# Patient Record
Sex: Female | Born: 1945 | Race: White | Hispanic: No | Smoking: Never smoker
Health system: Southern US, Community
[De-identification: ages and names within clinical notes are randomized; demographics above are authoritative.]

## PROBLEM LIST (undated history)

## (undated) DIAGNOSIS — I4891 Unspecified atrial fibrillation: Secondary | ICD-10-CM

## (undated) DIAGNOSIS — J449 Chronic obstructive pulmonary disease, unspecified: Secondary | ICD-10-CM

## (undated) DIAGNOSIS — J189 Pneumonia, unspecified organism: Secondary | ICD-10-CM

## (undated) DIAGNOSIS — T7840XA Allergy, unspecified, initial encounter: Secondary | ICD-10-CM

## (undated) DIAGNOSIS — R32 Unspecified urinary incontinence: Secondary | ICD-10-CM

## (undated) DIAGNOSIS — B159 Hepatitis A without hepatic coma: Secondary | ICD-10-CM

## (undated) DIAGNOSIS — J302 Other seasonal allergic rhinitis: Secondary | ICD-10-CM

## (undated) DIAGNOSIS — J9819 Other pulmonary collapse: Secondary | ICD-10-CM

## (undated) DIAGNOSIS — I10 Essential (primary) hypertension: Secondary | ICD-10-CM

## (undated) DIAGNOSIS — J45909 Unspecified asthma, uncomplicated: Secondary | ICD-10-CM

## (undated) HISTORY — DX: Unspecified asthma, uncomplicated: J45.909

## (undated) HISTORY — DX: Pneumonia, unspecified organism: J18.9

## (undated) HISTORY — PX: COLONOSCOPY: SHX174

## (undated) HISTORY — DX: Unspecified urinary incontinence: R32

## (undated) HISTORY — PX: DILATION AND CURETTAGE OF UTERUS: SHX78

## (undated) HISTORY — DX: Other pulmonary collapse: J98.19

## (undated) HISTORY — DX: Hepatitis a without hepatic coma: B15.9

## (undated) HISTORY — DX: Chronic obstructive pulmonary disease, unspecified: J44.9

## (undated) HISTORY — DX: Essential (primary) hypertension: I10

## (undated) HISTORY — DX: Allergy, unspecified, initial encounter: T78.40XA

## (undated) HISTORY — PX: TONSILLECTOMY: SUR1361

---

## 2000-05-07 HISTORY — PX: BREAST BIOPSY: SHX20

## 2003-05-08 DIAGNOSIS — I10 Essential (primary) hypertension: Secondary | ICD-10-CM | POA: Insufficient documentation

## 2003-05-08 HISTORY — DX: Essential (primary) hypertension: I10

## 2004-02-15 ENCOUNTER — Ambulatory Visit: Payer: Self-pay | Admitting: General Surgery

## 2005-02-22 ENCOUNTER — Ambulatory Visit: Payer: Self-pay | Admitting: General Surgery

## 2006-03-07 ENCOUNTER — Ambulatory Visit: Payer: Self-pay | Admitting: General Surgery

## 2007-03-11 ENCOUNTER — Ambulatory Visit: Payer: Self-pay | Admitting: General Surgery

## 2008-03-11 ENCOUNTER — Ambulatory Visit: Payer: Self-pay | Admitting: General Surgery

## 2009-03-14 ENCOUNTER — Ambulatory Visit: Payer: Self-pay | Admitting: General Surgery

## 2010-03-15 ENCOUNTER — Ambulatory Visit: Payer: Self-pay | Admitting: General Surgery

## 2011-05-11 ENCOUNTER — Ambulatory Visit: Payer: Self-pay | Admitting: General Surgery

## 2012-05-12 ENCOUNTER — Ambulatory Visit: Payer: Self-pay | Admitting: General Surgery

## 2012-05-23 LAB — URINALYSIS, COMPLETE
Bacteria: NONE SEEN
Bilirubin,UR: NEGATIVE
Glucose,UR: NEGATIVE mg/dL (ref 0–75)
Ketone: NEGATIVE
Leukocyte Esterase: NEGATIVE
Ph: 6 (ref 4.5–8.0)
Protein: NEGATIVE
RBC,UR: NONE SEEN /HPF (ref 0–5)
Specific Gravity: 1.006 (ref 1.003–1.030)
Squamous Epithelial: NONE SEEN
WBC UR: <1 /HPF (ref 0–5)

## 2012-05-23 LAB — COMPREHENSIVE METABOLIC PANEL
Albumin: 3.5 g/dL (ref 3.4–5.0)
Alkaline Phosphatase: 89 U/L (ref 50–136)
BUN: 17 mg/dL (ref 7–18)
Bilirubin,Total: 0.3 mg/dL (ref 0.2–1.0)
Calcium, Total: 8.2 mg/dL — ABNORMAL LOW (ref 8.5–10.1)
Co2: 27 mmol/L (ref 21–32)
EGFR (Non-African Amer.): >60
Osmolality: 281 (ref 275–301)
Potassium: 3.7 mmol/L (ref 3.5–5.1)
SGPT (ALT): 17 U/L (ref 12–78)
Sodium: 140 mmol/L (ref 136–145)
Total Protein: 7.1 g/dL (ref 6.4–8.2)

## 2012-05-23 LAB — PROTIME-INR
INR: 0.9
Prothrombin Time: 12.8 secs (ref 11.5–14.7)

## 2012-05-23 LAB — CBC
HGB: 13.4 g/dL (ref 12.0–16.0)
MCH: 31.3 pg (ref 26.0–34.0)
MCV: 92 fL (ref 80–100)

## 2012-05-23 LAB — MAGNESIUM: Magnesium: 1.9 mg/dL

## 2012-05-23 LAB — PROTEIN, CSF: Protein, CSF: 37 mg/dL (ref 15–45)

## 2012-05-24 ENCOUNTER — Inpatient Hospital Stay: Payer: Self-pay | Admitting: Cardiovascular Disease

## 2012-05-24 LAB — CSF CELL COUNT WITH DIFFERENTIAL
CSF Tube #: 3
Eosinophil: 0 %
Lymphocytes: 59 %
Lymphocytes: 67 %
Monocytes/Macrophages: 18 %
Monocytes/Macrophages: 22 %
Neutrophils: 11 %
Neutrophils: 24 %
Other Cells: 0 %
RBC (CSF): 542 /mm3
RBC (CSF): 60 /mm3
WBC (CSF): 2 /mm3
WBC (CSF): 5 /mm3

## 2012-05-24 LAB — LIPID PANEL
Cholesterol: 172 mg/dL (ref 0–200)
HDL Cholesterol: 73 mg/dL — ABNORMAL HIGH (ref 40–60)
Ldl Cholesterol, Calc: 85 mg/dL (ref 0–100)
Triglycerides: 69 mg/dL (ref 0–200)
VLDL Cholesterol, Calc: 14 mg/dL (ref 5–40)

## 2012-05-25 LAB — CBC WITH DIFFERENTIAL/PLATELET
Basophil #: 0 10*3/uL (ref 0.0–0.1)
Basophil %: 0.7 %
Eosinophil #: 0.1 10*3/uL (ref 0.0–0.7)
Eosinophil %: 1.8 %
Lymphocyte %: 43.9 %
MCV: 92 fL (ref 80–100)
Monocyte #: 0.6 x10 3/mm (ref 0.2–0.9)
Neutrophil %: 42.6 %
Platelet: 201 10*3/uL (ref 150–440)
RBC: 4.25 10*6/uL (ref 3.80–5.20)
RDW: 13.2 % (ref 11.5–14.5)

## 2012-05-25 LAB — BASIC METABOLIC PANEL
Anion Gap: 7 (ref 7–16)
BUN: 17 mg/dL (ref 7–18)
Chloride: 105 mmol/L (ref 98–107)
Co2: 26 mmol/L (ref 21–32)
EGFR (African American): >60
EGFR (Non-African Amer.): >60
Glucose: 105 mg/dL — ABNORMAL HIGH (ref 65–99)
Osmolality: 278 (ref 275–301)
Sodium: 138 mmol/L (ref 136–145)

## 2012-05-25 LAB — PROTIME-INR: INR: 1

## 2012-05-26 LAB — CSF CULTURE

## 2012-11-14 ENCOUNTER — Encounter: Payer: Self-pay | Admitting: *Deleted

## 2013-07-01 ENCOUNTER — Encounter: Payer: Self-pay | Admitting: General Surgery

## 2013-07-01 ENCOUNTER — Ambulatory Visit: Payer: Self-pay | Admitting: General Surgery

## 2013-07-16 ENCOUNTER — Ambulatory Visit (INDEPENDENT_AMBULATORY_CARE_PROVIDER_SITE_OTHER): Payer: Medicare Other | Admitting: General Surgery

## 2013-07-16 ENCOUNTER — Encounter: Payer: Self-pay | Admitting: General Surgery

## 2013-07-16 VITALS — BP 132/70 | HR 74 | Resp 14 | Ht 68.0 in | Wt 214.0 lb

## 2013-07-16 DIAGNOSIS — N6019 Diffuse cystic mastopathy of unspecified breast: Secondary | ICD-10-CM

## 2013-07-16 DIAGNOSIS — Z1211 Encounter for screening for malignant neoplasm of colon: Secondary | ICD-10-CM

## 2013-07-16 MED ORDER — POLYETHYLENE GLYCOL 3350 17 GM/SCOOP PO POWD: 1.0000 | Freq: Once | ORAL | Status: DC

## 2013-07-16 NOTE — Patient Instructions (Addendum)
Follow up in 1 year with bilateral screening mammogram and office visit  Colonoscopy A colonoscopy is an exam to look at the entire large intestine (colon). This exam can help find problems such as tumors, polyps, inflammation, and areas of bleeding. The exam takes about 1 hour.  LET [facility] PROVIDER KNOW ABOUT:   Any allergies you have.  All medicines you are taking, including vitamins, herbs, eye drops, creams, and over-the-counter medicines.  Previous problems you or members of your family have had with the use of anesthetics.  Any blood disorders you have.  Previous surgeries you have had.  Medical conditions you have. RISKS AND COMPLICATIONS  Generally, this is a safe procedure. However, as with any procedure, complications can occur. Possible complications include:  Bleeding.  Tearing or rupture of the colon wall.  Reaction to medicines given during the exam.  Infection (rare). BEFORE THE PROCEDURE   Ask your health care provider about changing or stopping your regular medicines.  You may be prescribed an oral bowel prep. This involves drinking a large amount of medicated liquid, starting the day before your procedure. The liquid will cause you to have multiple loose stools until your stool is almost clear or light green. This cleans out your colon in preparation for the procedure.  Do not eat or drink anything else once you have started the bowel prep, unless your health care provider tells you it is safe to do so.  Arrange for someone to drive you home after the procedure. PROCEDURE   You will be given medicine to help you relax (sedative).  You will lie on your side with your knees bent.  A long, flexible tube with a light and camera on the end (colonoscope) will be inserted through the rectum and into the colon. The camera sends video back to a computer screen as it moves through the colon. The colonoscope also releases carbon dioxide gas to inflate the  colon. This helps your health care provider see the area better.  During the exam, your health care provider may take a small tissue sample (biopsy) to be examined under a microscope if any abnormalities are found.  The exam is finished when the entire colon has been viewed. AFTER THE PROCEDURE   Do not drive for 24 hours after the exam.  You may have a small amount of blood in your stool.  You may pass moderate amounts of gas and have mild abdominal cramping or bloating. This is caused by the gas used to inflate your colon during the exam.  Ask when your test results will be ready and how you will get your results. Make sure you get your test results. Document Released: 04/20/2000 Document Revised: 02/11/2013 Document Reviewed: 12/29/2012 [facility] Patient Information 2014 [employer] [place] [place].   Patient has been [facility]. Patient is to hold Fish Oil supplements for 1 week prior to colonoscopy. She will reregister with the hospital at least 2 days prior. She will only take her Norvasc and Toprol blood pressure medications the morning of the colonoscopy with a small sip of water. The Miralax prescription has been sent to her pharmacy. Patient is aware of date and instructions.

## 2013-07-16 NOTE — Progress Notes (Signed)
Patient ID: Kathy GammonCherry B Ashley, female   DOB: 01/12/1946, 68 y.o.   MRN: [id number]  Chief Complaint  Patient presents with  . Follow-up    mammogram    HPI Kathy Ashley is a 68 y.o. female.  who presents for annual breast evaluation. The most recent mammogram was done on 07-01-13.  Patient does perform regular self breast checks and gets regular mammograms done.  No new breast issues.  HPI  Past Medical History  Diagnosis Date  . Hypertension 2005  . Allergy     Past Surgical History  Procedure Laterality Date  . Breast biopsy Right 2002  . Cesarean section    . Dilation and curettage of uterus    . Colonoscopy  2005    Family History  Problem Relation Age of Onset  . Diabetes Father     Social History History  Substance Use Topics  . Smoking status: Never Smoker   . Smokeless tobacco: Never Used  . Alcohol Use: Yes     Comment: wine occasionally    Allergies  Allergen Reactions  . Penicillins Rash    Current Outpatient Prescriptions  Medication Sig Dispense Refill  . amLODipine (NORVASC) 5 MG tablet Take 5 mg by mouth daily.       Marland Kitchen. escitalopram (LEXAPRO) 20 MG tablet Take 20 mg by mouth daily.       . furosemide (LASIX) 20 MG tablet Take 20 mg by mouth as needed.       Marland Kitchen. LORazepam (ATIVAN) 1 MG tablet Take 1 mg by mouth every 8 (eight) hours as needed.       . metoprolol succinate (TOPROL-XL) 50 MG 24 hr tablet Take 50 mg by mouth daily.       . polyethylene glycol powder (GLYCOLAX/MIRALAX) powder Take 255 g (1 Container total) by mouth once.  255 g  0   No current facility-administered medications for this visit.    Review of Systems Review of Systems  Constitutional: Negative.   Respiratory: Negative.   Cardiovascular: Negative.     Blood pressure 132/70, pulse 74, resp. rate 14, height 5\' 8"  (1.727 m), weight 214 lb (97.07 kg).  Physical Exam Physical Exam  Constitutional: She is oriented to person, place, and time. She appears well-developed and  well-nourished.  Eyes: No scleral icterus.  Neck: Neck supple.  Cardiovascular: Normal rate, regular rhythm and normal heart sounds.   Pulmonary/Chest: Effort normal and breath sounds normal. Right breast exhibits no inverted nipple, no mass, no nipple discharge, no skin change and no tenderness. Left breast exhibits no inverted nipple, no mass, no nipple discharge, no skin change and no tenderness.  Abdominal: Soft. Normal appearance.  Lymphadenopathy:    She has no cervical adenopathy.    She has no axillary adenopathy.  Neurological: She is alert and oriented to person, place, and time.  Skin: Skin is warm and dry.    Data Reviewed Mammogram reviewed and stable.   Assessment    Stable physical exam.     Plan      Follow up in 1 year with bilateral screening mammogram and office visit.  Last colonoscopy was 2005.  Colonoscopy with possible biopsy/polypectomy prn: Information regarding the procedure, including its potential risks and complications (including but not limited to perforation of the bowel, which may require emergency surgery to repair, and bleeding) was verbally given to the patient. Educational information regarding lower instestinal endoscopy was given to the patient. Written instructions for how to complete the  bowel prep using Miralax were provided. The importance of drinking ample fluids to avoid dehydration as a result of the prep emphasized.   Patient has been scheduled for a colonoscopy on 07/29/13 at [facility]. Patient is to hold Fish Oil supplements for 1 week prior to colonoscopy. She will reregister with the hospital at least 2 days prior. She will only take her Norvasc and Toprol blood pressure medications the morning of the colonoscopy with a small sip of water. The Miralax prescription has been sent to her pharmacy. Patient is aware of date and instructions.   Abdurrahman Petersheim G 07/16/2013, 10:53 AM

## 2013-07-25 ENCOUNTER — Other Ambulatory Visit: Payer: Self-pay | Admitting: General Surgery

## 2013-07-25 DIAGNOSIS — Z1211 Encounter for screening for malignant neoplasm of colon: Secondary | ICD-10-CM

## 2013-07-29 ENCOUNTER — Ambulatory Visit: Payer: Self-pay | Admitting: General Surgery

## 2013-07-29 DIAGNOSIS — Z1211 Encounter for screening for malignant neoplasm of colon: Secondary | ICD-10-CM

## 2013-07-29 DIAGNOSIS — K5730 Diverticulosis of large intestine without perforation or abscess without bleeding: Secondary | ICD-10-CM

## 2013-09-01 ENCOUNTER — Encounter: Payer: Self-pay | Admitting: General Surgery

## 2013-10-28 ENCOUNTER — Ambulatory Visit: Payer: Self-pay | Admitting: Ophthalmology

## 2013-10-28 DIAGNOSIS — I10 Essential (primary) hypertension: Secondary | ICD-10-CM

## 2013-10-28 DIAGNOSIS — Z01810 Encounter for preprocedural cardiovascular examination: Secondary | ICD-10-CM

## 2013-10-28 LAB — POTASSIUM: Potassium: 4.1 mmol/L (ref 3.5–5.1)

## 2013-11-11 ENCOUNTER — Ambulatory Visit: Payer: Self-pay | Admitting: Ophthalmology

## 2013-12-09 ENCOUNTER — Ambulatory Visit: Payer: Self-pay | Admitting: Ophthalmology

## 2014-03-08 ENCOUNTER — Encounter: Payer: Self-pay | Admitting: General Surgery

## 2014-05-07 DIAGNOSIS — J189 Pneumonia, unspecified organism: Secondary | ICD-10-CM

## 2014-05-07 DIAGNOSIS — J9819 Other pulmonary collapse: Secondary | ICD-10-CM

## 2014-05-07 DIAGNOSIS — B159 Hepatitis A without hepatic coma: Secondary | ICD-10-CM

## 2014-05-07 HISTORY — DX: Other pulmonary collapse: J98.19

## 2014-05-07 HISTORY — DX: Pneumonia, unspecified organism: J18.9

## 2014-05-07 HISTORY — DX: Hepatitis a without hepatic coma: B15.9

## 2014-05-31 ENCOUNTER — Ambulatory Visit: Payer: Self-pay | Admitting: Internal Medicine

## 2014-07-06 ENCOUNTER — Encounter: Payer: Self-pay | Admitting: General Surgery

## 2014-07-06 ENCOUNTER — Ambulatory Visit: Payer: Self-pay | Admitting: General Surgery

## 2014-07-12 ENCOUNTER — Ambulatory Visit: Payer: Medicare Other | Admitting: General Surgery

## 2014-07-14 ENCOUNTER — Encounter: Payer: Self-pay | Admitting: General Surgery

## 2014-07-14 ENCOUNTER — Ambulatory Visit (INDEPENDENT_AMBULATORY_CARE_PROVIDER_SITE_OTHER): Payer: Medicare Other | Admitting: General Surgery

## 2014-07-14 VITALS — BP 128/64 | HR 74 | Resp 14 | Ht 67.5 in | Wt 208.0 lb

## 2014-07-14 DIAGNOSIS — N6019 Diffuse cystic mastopathy of unspecified breast: Secondary | ICD-10-CM

## 2014-07-14 DIAGNOSIS — Z1211 Encounter for screening for malignant neoplasm of colon: Secondary | ICD-10-CM

## 2014-07-14 NOTE — Patient Instructions (Signed)
Patient to return in 1 year for follow up with bilateral screening mammogram. Continue self breast exams. Call office for any new breast issues or concerns.

## 2014-07-14 NOTE — Progress Notes (Signed)
Patient ID: Kathy Ashley, female   DOB: 05-28-1945, 69 y.o.   MRN: [id number]  Chief Complaint  Patient presents with  . Follow-up    mammogram    HPI Kathy Ashley is a 69 y.o. female who presents for a breast evaluation. The most recent mammogram was done on 07/06/14. Patient does perform regular self breast checks and gets regular mammograms done. She denies any problems at this time.     Last yr she has Hepatitis A which resolved, later developed pneumonia and chronic cough, slowly getting better  HPI  Past Medical History  Diagnosis Date  . Hypertension 2005  . Allergy   . Hepatitis A 2016  . Collapsed lung 2016  . Pneumonia 2016    Past Surgical History  Procedure Laterality Date  . Breast biopsy Right 2002  . Cesarean section    . Dilation and curettage of uterus    . Colonoscopy  4098,1191    Family History  Problem Relation Age of Onset  . Diabetes Father     Social History History  Substance Use Topics  . Smoking status: Never Smoker   . Smokeless tobacco: Never Used  . Alcohol Use: Yes     Comment: wine occasionally    Allergies  Allergen Reactions  . Penicillins Rash    Current Outpatient Prescriptions  Medication Sig Dispense Refill  . albuterol (PROVENTIL HFA;VENTOLIN HFA) 108 (90 BASE) MCG/ACT inhaler Inhale 2 puffs into the lungs 2 (two) times daily.    Marland Kitchen albuterol (PROVENTIL) (2.5 MG/3ML) 0.083% nebulizer solution Take 2.5 mg by nebulization every 6 (six) hours as needed for wheezing or shortness of breath.    Marland Kitchen amLODipine (NORVASC) 5 MG tablet Take 5 mg by mouth daily.     Marland Kitchen aspirin 81 MG tablet Take 81 mg by mouth daily.    Marland Kitchen escitalopram (LEXAPRO) 20 MG tablet Take 20 mg by mouth daily.     . furosemide (LASIX) 20 MG tablet Take 20 mg by mouth as needed.     Marland Kitchen LORazepam (ATIVAN) 1 MG tablet Take 1 mg by mouth every 8 (eight) hours as needed.     . metoprolol succinate (TOPROL-XL) 50 MG 24 hr tablet Take 50 mg by mouth  daily.     . montelukast (SINGULAIR) 10 MG tablet Take 10 mg by mouth daily.  4  . Omega-3 Fatty Acids (FISH OIL) 1200 MG CAPS Take 1 capsule by mouth daily.    . pantoprazole (PROTONIX) 40 MG tablet Take 1 tablet by mouth daily.     No current facility-administered medications for this visit.    Review of Systems Review of Systems  Constitutional: Negative.   Respiratory: Negative.   Cardiovascular: Negative.     Blood pressure 128/64, pulse 74, resp. rate 14, height 5' 7.5" (1.715 m), weight 208 lb (94.348 kg).  Physical Exam Physical Exam  Constitutional: She is oriented to person, place, and time. She appears well-developed and well-nourished.  Neck: Neck supple. No thyromegaly present.  Cardiovascular: Normal rate, regular rhythm and normal heart sounds.   No murmur heard. Pulmonary/Chest: Effort normal and breath sounds normal. Right breast exhibits no inverted nipple, no mass, no nipple discharge, no skin change and no tenderness. Left breast exhibits no inverted nipple, no mass, no nipple discharge, no skin change and no tenderness.  Abdominal: Soft. Normal appearance and bowel sounds are normal. There is no hepatosplenomegaly. There is no tenderness. No hernia.  Lymphadenopathy:    She has  no cervical adenopathy.    She has no axillary adenopathy.  Neurological: She is alert and oriented to person, place, and time.  Skin: Skin is warm and dry.    Data Reviewed  Mammogram reviewed and stable.   Assessment    Stable exam. History of benign breast biopsies in past    Plan    Patient to return in 1 year with bilateral screening mammogram.        Kathy Ashley G 07/14/2014, 11:00 AM

## 2014-08-17 ENCOUNTER — Ambulatory Visit
Admit: 2014-08-17 | Disposition: A | Discharge status: Home / Self Care | Payer: Self-pay | Attending: Cardiovascular Disease | Admitting: Cardiovascular Disease

## 2014-08-27 NOTE — Discharge Summary (Signed)
Dates of Admission and Diagnosis:   Date of Admission 24-May-2012    Date of Discharge 25-May-2012    Admitting Diagnosis Right sided weakness, HA    Final Diagnosis Right acute sphenoid sinusitis    Discharge Diagnosis 1 Right acute sphenoid sinusitis    2 HTN    3 Hyperlipidemia    4 BLE edema     Chief Complaint/History of Present Illness Right sided weakness with HA and difficulty forming words per patient report three days ago.  Hx of HTN, HL and BLE edema.  HA has resolved, no neurological deficits during hospitalization.  Head CT was no acute abnormality and right sphenoid sinusitis for which Levaquin 750 mg IV daily was initiated.  MRI of brain with no acute infarct.  [place] bilateral carotid Doppler with no significant stenosis.  CXR neg.  Labs overall WNL.  LP neg.   Hepatic:  17-Jan-14 20:16    Bilirubin, Total 0.3   Alkaline Phosphatase 89   SGPT (ALT) 17   SGOT (AST)  14   Total Protein, Serum 7.1   Albumin, Serum 3.5  Routine Micro:  17-Jan-14 22:17    Micro Text Report CSF CULTURE/AERO/GRAM ST   COMMENT                   NO GROWTH IN 8-12 HOURS   GRAM STAIN                RARE WHITE BLOOD CELLS   GRAM STAIN                RARE RED BLOOD CELLS   GRAM STAIN                NO ORGANISMS SEEN   ANTIBIOTIC              Specimen Source CSF   Culture Comment NO GROWTH IN 8-12 HOURS   Gram Stain 1 RARE WHITE BLOOD CELLS   Gram Stain 2 RARE RED BLOOD CELLS   Gram Stain 3 NO ORGANISMS SEEN  Result(s) reported on 24 May 2012 at 11:46AM.  Cardiology:  17-Jan-14 20:36    Ventricular Rate 73   Atrial Rate 73   P-R Interval 154   QRS Duration 84   QT 384   QTc 423   P Axis 71   R Axis -22   T Axis 3   ECG interpretation Normal sinus rhythm Low voltage QRS Inferior infarct , age undetermined Cannot rule out Anterior infarct , age undetermined Abnormal ECG No previous ECGs available ----------unconfirmed---------- Confirmed by OVERREAD, NOT ([id number]), editor  PEARSON, BARBARA ([id number]) on 05/24/2012 7:44:14 AM  18-Jan-14 11:24    Echo Doppler  Interpretation Summary   Left ventricular systolic function is normal.ef 55% The left atrium  is mildly dilated. The right atrium is mildly dilated. There is mild  to moderate mitral regurgitation. There is mild to moderate  tricuspid regurgitation. no apparent source of cva  Procedure:   A two-dimensional transthoracic echocardiogram with color flow and  Doppler was performed.  Left Ventricle   The left ventricle is normal in size.   Left ventricular systolic function is normal.  Right Ventricle   The right ventricle is normal in size and function.  Atria   The left atrium is mildly dilated.   The right atrium is mildly dilated.  Mitral Valve   The mitral valve leaflets appear thickened, but open well.   There is mild to moderate mitral regurgitation.  Tricuspid Valve   The tricuspid valve is not well visualized, but is grossly normal.   There is mild to moderate tricuspid regurgitation.  Aortic Valve   The aortic valve is normal in structure and function.   No aortic regurgitation is present.  Pulmonic Valve   The pulmonic valve is not well seen, but is grossly normal.  Great Vessels   The aortic root is normal size.  Pericardium/Pleural   No pericardial effusion.  MMode 2D Measurements and Calculations   IVSd: 1.00 cm   LVIDd: 4.7 cm   LVIDs: 3.0 cm   LVPWd: 1.0 cm   FS: 35 %   EF(Teich): 64 %   Ao root diam: 2.6 cm   LA dimension: 4.4 cm  Doppler Measurements and Calculations   MV E point: 83 cm/sec   MV A point: 63 cm/sec   MV E/A: 1.3    MV dec time: 0.24 sec   TR Max vel: 263 cm/sec   TR Max PG: 28 mmHg   RVSP: 33 mmHg   RAP systole: 5.0 mmHg  Reading Physician: Serafina Royals  Sonographer: Lana Fish Interpreting Physician:  Serafina Royals,  electronically signed on  05-25-2012 06:55:12 Requesting Physician: Serafina Royals  Routine Chem:  17-Jan-14 20:16     Glucose, Serum 95   BUN 17   Creatinine (comp) 0.63   Sodium, Serum 140   Potassium, Serum 3.7   Chloride, Serum 107   CO2, Serum 27   Calcium (Total), Serum  8.2   Anion Gap  6   Osmolality (calc) 281   eGFR (African American) >60   eGFR (Non-African American) >60 (eGFR values <47mL/min/1.73 m2 may be an indication of chronic kidney disease (CKD). Calculated eGFR is useful in patients with stable renal function. The eGFR calculation will not be reliable in acutely ill patients when serum creatinine is changing rapidly. It is not useful in  patients on dialysis. The eGFR calculation may not be applicable to patients at the low and high extremes of body sizes, pregnant women, and vegetarians.)   Magnesium, Serum 1.9 (1.8-2.4 THERAPEUTIC RANGE: 4-7 mg/dL TOXIC: > 10 mg/dL  -----------------------)  18-Jan-14 06:39    Cholesterol, Serum 172   Triglycerides, Serum 69   HDL (INHOUSE)  73   VLDL Cholesterol Calculated 14   LDL Cholesterol Calculated 85 (Result(s) reported on 24 May 2012 at 08:13AM.)  19-Jan-14 04:58    Glucose, Serum  105   BUN 17   Creatinine (comp) 0.84   Sodium, Serum 138   Potassium, Serum 3.9   Chloride, Serum 105   CO2, Serum 26   Calcium (Total), Serum  8.1   Anion Gap 7   Osmolality (calc) 278   eGFR (African American) >60   eGFR (Non-African American) >60 (eGFR values <56mL/min/1.73 m2 may be an indication of chronic kidney disease (CKD). Calculated eGFR is useful in patients with stable renal function. The eGFR calculation will not be reliable in acutely ill patients when serum creatinine is changing rapidly. It is not useful in  patients on dialysis. The eGFR calculation may not be applicable to patients at the low and high extremes of body sizes, pregnant women, and vegetarians.)  Routine UA:  17-Jan-14 20:16    Color (UA) Straw   Clarity (UA) Clear   Glucose (UA) Negative   Bilirubin (UA) Negative   Ketones (UA) Negative   Specific  Gravity (UA) 1.006   Blood (UA) Negative   pH (UA) 6.0   Protein (UA)  Negative   Nitrite (UA) Negative   Leukocyte Esterase (UA) Negative (Result(s) reported on 23 May 2012 at 08:39PM.)   RBC (UA) NONE SEEN   WBC (UA) <1 /HPF   Bacteria (UA) NONE SEEN   Epithelial Cells (UA) NONE SEEN  Result(s) reported on 23 May 2012 at 08:39PM.  CSF Analysis:  17-Jan-14 22:17    CSF Tube # 3   CSF Tube # 1   Color - Uncentrifuged (CSF) COLORLESS   Color - Uncentrifuged (CSF) COLORLESS   Clarity (CSF) CLEAR   Clarity (CSF) HAZY   Color - Centrifuged (CSF) COLORLESS   Color - Centrifuged (CSF) COLORLESS   RBC  (CSF) 60   RBC  (CSF) 542   WBC  (CSF) 2   WBC  (CSF) 5   Neutrophils  (CSF) 11   Neutrophils  (CSF) 24   Lymphocytes  (CSF) 67   Lymphocytes  (CSF) 59   Monocytes/Macrophages  (CSF) 22   Monocytes/Macrophages  (CSF) 18   Eosinophil  (CSF) 0   Eosinophil  (CSF) 0   Other Cells  (CSF) 0 (Result(s) reported on 24 May 2012 at 12:29AM.)   Other Cells  (CSF) 0 (Result(s) reported on 24 May 2012 at 12:24AM.)   Protein, CSF 37 (Result(s) reported on 23 May 2012 at 11:46PM.)   Glucose, CSF 54 (Result(s) reported on 23 May 2012 at 11:46PM.)  Routine Coag:  17-Jan-14 20:16    Prothrombin 12.8   INR 0.9 (INR reference interval applies to patients on anticoagulant therapy. A single INR therapeutic range for coumarins is not optimal for all indications; however, the suggested range for most indications is 2.0 - 3.0. Exceptions to the INR Reference Range may include: Prosthetic heart valves, acute myocardial infarction, prevention of myocardial infarction, and combinations of aspirin and anticoagulant. The need for a higher or lower target INR must be assessed individually. Reference: The Pharmacology and Management of the Vitamin K  antagonists: the seventh ACCP Conference on Antithrombotic and Thrombolytic Therapy. WIOXB.3532 Sept:126 (3suppl): [id number]. A HCT value >55% may  artifactually increase the PT.  In one study,  the increase was an average of 25%. Reference:  "Effect on Routine and Special Coagulation Testing Values of Citrate Anticoagulant Adjustment in Patients with High HCT Values." American Journal of Clinical Pathology 2006;126:400-405.)  19-Jan-14 04:58    Prothrombin 13.4   INR 1.0 (INR reference interval applies to patients on anticoagulant therapy. A single INR therapeutic range for coumarins is not optimal for all indications; however, the suggested range for most indications is 2.0 - 3.0. Exceptions to the INR Reference Range may include: Prosthetic heart valves, acute myocardial infarction, prevention of myocardial infarction, and combinations of aspirin and anticoagulant. The need for a higher or lower target INR must be assessed individually. Reference: The Pharmacology and Management of the Vitamin K  antagonists: the seventh ACCP Conference on Antithrombotic and Thrombolytic Therapy. DJMEQ.6834 Sept:126 (3suppl): N9146842. A HCT value >55% may artifactually increase the PT.  In one study,  the increase was an average of 25%. Reference:  "Effect on Routine and Special Coagulation Testing Values of Citrate Anticoagulant Adjustment in Patients with High HCT Values." American Journal of Clinical Pathology 2006;126:400-405.)  Routine Hem:  17-Jan-14 20:16    WBC (CBC) 6.9   RBC (CBC) 4.28   Hemoglobin (CBC) 13.4   Hematocrit (CBC) 39.5   Platelet Count (CBC) 233 (Result(s) reported on 23 May 2012 at 08:35PM.)   MCV 92   MCH 31.3  MCHC 33.9   RDW 13.2  19-Jan-14 04:58    WBC (CBC) 5.5   RBC (CBC) 4.25   Hemoglobin (CBC) 13.3   Hematocrit (CBC) 39.0   Platelet Count (CBC) 201   MCV 92   MCH 31.3   MCHC 34.1   RDW 13.2   Neutrophil % 42.6   Lymphocyte % 43.9   Monocyte % 11.0   Eosinophil % 1.8   Basophil % 0.7   Neutrophil # 2.4   Lymphocyte # 2.4   Monocyte # 0.6   Eosinophil # 0.1   Basophil # 0.0 (Result(s)  reported on 25 May 2012 at 05:34AM.)   PERTINENT RADIOLOGY STUDIES: XRay:    18-Jan-14 12:03, Chest PA and Lateral   Chest PA and Lateral    REASON FOR EXAM:    cough, SOB  COMMENTS:       PROCEDURE: DXR - DXR CHEST PA (OR AP) AND LATERAL  - May 24 2012 12:03PM     RESULT: Comparison: None.    Findings:  The heart and mediastinum are within normal limits. No focal pulmonary   opacities. There is mild biapical pleural-parenchymal thickening.    IMPRESSION:   No acute cardiopulmonary disease.    Dictation Site: 8        Verified By: Gregor Hams, M.D., MD  [place]:    18-Jan-14 08:46, US Carotid Doppler Bilateral   US Carotid Doppler Bilateral    REASON FOR EXAM:    CVA  COMMENTS:       PROCEDURE: [place]  - US CAROTID DOPPLER BILATERAL  - May 24 2012  8:46AM     RESULT: Comparison: None    Technique: Gray-scale, color Doppler, and spectral Doppler images were   obtained of the extracranial carotid artery systems and vertebral   arteries in the neck.    Findings:  No significant atherosclerotic plaques identified in the extracranial   carotid arteries.  The peak systolic velocities are not elevated.  The vertebral arteries are patent bilaterally.    IMPRESSION:    No evidence of hemodynamically significant stenosis in the extracranial   carotid arteries.      Dictation Site: 8        Verified By: Gregor Hams, M.D., MD  MRI:    18-Jan-14 09:22, MRI Brain Without Contrast   MRI Brain Without Contrast    REASON FOR EXAM:    CVA  COMMENTS:       PROCEDURE: MR  - MR BRAIN WO CONTRAST  - May 24 2012  9:22AM     RESULT: Comparison: CT of the brain 05/23/2012    Technique: Standard brain protocol, without intravenous contrast.    Findings:  There are a fewscattered foci of T2 hyperintensity in the subcortical   white matter. 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 There are no areas of restricted    diffusion to suggest acute infarct. No evidenceof mass, mass effect,   midline shift, or extra-axial fluid collection. The intracranial flow     voids are unremarkable.    There is a small amount of fluid in the right mastoid air cells, which is   nonspecific. There is severe mucosal thickening of the right sphenoid   sinus.    IMPRESSION:   No acute intracranial findings. No acute infarct.      Dictation Site: 8        Verified By: Gregor Hams,  M.D., MD  CT:    17-Jan-14 15:18, CT Head Without Contrast   CT Head Without Contrast    REASON FOR EXAM:    headache  COMMENTS:   May transport without cardiac monitor    PROCEDURE: CT  - CT HEAD WITHOUT CONTRAST  - May 23 2012  3:18PM     RESULT: Axial noncontrast CT scanning was performed through the brain   with reconstructions at 5 mm intervals and slice thicknesses.    There is mild bifrontal cerebral atrophy. The ventricles are normal in   size and position. There is no intracranial hemorrhage nor intracranial   mass effect. There is no evidence of an evolving ischemic infarction.   There are no abnormal intracranial calcifications.    At bone window settings there is soft tissue density material in the   right sphenoid sinus consistent with mucoperiosteal thickening. No     definite fluid levels are demonstrated. There isno evidence of an acute   skull fracture.    IMPRESSION:   1. There is no evidence of acute abnormality in the brain.  2. There is soft tissue density material in the right sphenoid sinus cell   consistent with sinusitis.     Dictation Site: 1        Verified By: DAVID A. Martinique, M.D., MD   Hospital Course:   Hospital Course VSS overall.  HA has resolved, no neurological deficits during hospitalization.  Head CT was no acute abnormality and right sphenoid sinusitis for which Levaquin 750 mg IV daily was initiated.  MRI of brain with no acute infarct.  [place] bilateral carotid Doppler with no  significant stenosis.  CXR neg.  Labs overall WNL.  LP neg.  PT cleared patient with no neurological deficits.  Neurology consult was ordered but not done as patient is ready to be discharged today.    Condition on Discharge Stable   DISCHARGE INSTRUCTIONS HOME MEDS:  Medication Reconciliation:  Patient's Home Medications at Discharge:     Medication Instructions  proair hfa 90 mcg/inh inhalation aerosol  2 puff(s) inhaled 4 times a day as needed for shortness of breath/wheezing.    amlodipine 5 mg oral tablet  1 tab(s) orally once a day (in the morning)   metoprolol succinate 50 mg oral tablet, extended release  1 tab(s) orally once a day (in the morning)   lexapro 10 mg oral tablet  1 tab(s) orally once a day (at bedtime)   furosemide 20 mg oral tablet  1 tab(s) orally once a day as needed for fluid.   fish oil 1200 mg oral capsule  1 cap(s) orally once a day   aspirin enteric coated 81 mg oral delayed release tablet  1 tab(s) orally once a day (in the morning)   biotin  1 tab(s) orally once a day (in the morning)   acetaminophen 325 mg oral tablet  2 tab(s) orally every 6 hours, As needed, pain   simvastatin 20 mg oral tablet  1 tab(s) orally once a day (at bedtime)     PRESCRIPTIONS: PRINTED AND GIVEN TO PATIENT/FAMILY   Physician's Instructions:   Home Health? No    Treatments None    Home Oxygen? No    Diet Low Sodium  Low Fat, Low Cholesterol    Diet Consistency Regular Consistency    Activity Limitations As tolerated    Referrals None    Return to Work after follow up visit with MD    Time frame  for Follow Up Appointment 1-2 days    Other Comments Unable to access prescription writer in the system so will continue Levaquin 750 mg orally daily x 7 days and script written.  Given to patient.   Electronic Signatures: Kasandra Knudsen B (NP)  (Signed 19-Jan-14 10:08)  Authored: ADMISSION DATE AND DIAGNOSIS, CHIEF COMPLAINT/HPI, PERTINENT LABS, PERTINENT RADIOLOGY  STUDIES, HOSPITAL COURSE, DISCHARGE INSTRUCTIONS HOME MEDS, PATIENT INSTRUCTIONS   Last Updated: 19-Jan-14 10:08 by Kasandra Knudsen B (NP)

## 2014-08-27 NOTE — H&P (Signed)
PATIENT NAME:  Kathy, Ashley MR#:  [id number] DATE OF BIRTH:  [birth year]  DATE OF ADMISSION:  05/24/2012  PRIMARY CARE PHYSICIAN: Dr. Yves Dill   REFERRING DOCTOR:  Dr. Brien Mates.  CHIEF COMPLAINT: Severe headache since yesterday and right-sided weakness since this morning.   HISTORY OF PRESENT ILLNESS: The patient is a 69 year old Caucasian female with a past medical history of hypertension, chronic lower extremity edema and hyperlipidemia, takes fish oil, is presenting to the ER with the chief complaint of severe headache, which has started yesterday. The patient is reporting that she has been experiencing some dull headache for the past one week. Yesterday night while she was taking her trash she bent down and when she stood up she started having piercing right-sided headache. The headache was persistent and then eventually she has noticed that she was unable to complete sentences. The patient thought maybe she is anxious and she went to sleep. Today morning she started noticing right-sided weakness. She come into the ER, a CAT scan of the head was done which was negative for any acute findings. As the patient was persistently having piercing headache, LP was done by the ER physician and the CSF spinal fluid was clear in appearance. CSF glucose and protein were normal. The Hospitalist team is called to admit the patient as the patient is still having headache and the right-sided weakness. During my examination, the patient is reporting that she did not experience any difficulty in swallowing or dizziness or nausea. The dysarthria was quite transient and lasted for a few minutes and it was completely resolved last night. No similar complaints in the past. Denies any chest pain or shortness of breath. No other complaints.  PAST MEDICAL HISTORY: Hypertension, hyperlipidemia, takes fish oil, chronic lower extremity edema.   PAST SURGICAL HISTORY: None.   ALLERGIES: PENICILLIN.  MEDICATIONS: ProAir HFA  2 puffs inhalation q.4 hours as needed, metoprolol succinate 1 tablet p.o. once a day, Lexapro 10 mg once a day, furosemide 20 mg once a day, fish oil 1 capsule p.o. once a day, biotin 1 tablet p.o. once a day, aspirin 81 mg enteric-coated p.o. once daily, amlodipine 5 mg p.o. once daily.   PSYCHOSOCIAL HISTORY: Lives alone. Denies smoking, occasionally takes alcohol for social reasons. Denies any illicit drug usage.   FAMILY HISTORY: Mother had a triple bypass grafting for coronary artery disease and strokes, and dad deceased with heart attack. She is reporting that heart problems run in her family.   REVIEW OF SYSTEMS: CONSTITUTIONAL: Denies fever, fatigue, pain, weight loss or weight gain.  EYES: Denies any blurry vision, glaucoma, cataracts.  ENT: Denies tinnitus, ear pain, hearing loss, postnasal drip, sinus pain.  RESPIRATORY: Denies cough, wheezing, hemoptysis, or pneumonia.  CARDIOVASCULAR: Denies chest pain, palpitations, syncope.  GASTROINTESTINAL: Denies nausea, vomiting, diarrhea, hematemesis, melena.  GENITOURINARY: Denies dysuria, hematuria, renal calculi.  GYNECOLOGIC AND BREASTS: Denies breast mass, vaginal discharge.  ENDOCRINE: Denies polyuria, polyphagia, polydipsia.  HEMATOLOGIC AND LYMPHATIC: Denies anemia, easy bruising, swollen glands.  MUSCULOSKELETAL: Denies pain in the neck, complaining of headache but denies any back or shoulder pain. NEUROLOGIC: Complaining of right-sided weakness. Denies any vertigo, ataxia, dementia, complaining of headaches for the past one week which is worse since yesterday night.  PSYCHIATRIC: Denies insomnia, ADD, OCD.   PHYSICAL EXAMINATION:  VITAL SIGNS: Temperature 98.4, pulse 68, respirations 18, blood pressure 128/71, pulse ox 98% on room air.  GENERAL APPEARANCE: Not in acute distress, moderately built and moderately nourished.  HEENT: Normocephalic,  atraumatic. Pupils are equally reacting to light and accommodation. No conjunctival  injection. Nasal: No congestion. No sinus tenderness. No postnasal drip.  NECK: Supple. No JVD. No carotid bruits. No thyromegaly.  LUNGS: Clear to auscultation bilaterally. No crackles. No wheezing. No accessory muscle usage. No anterior chest wall tenderness on palpation.  CARDIAC: S1, S2 normal. Regular rate and rhythm. No murmurs.  GASTROINTESTINAL: Soft. Bowel sounds are positive in all four quadrants. Nontender, nondistended. No masses felt.  NEUROLOGIC: Awake, alert, oriented x 3. Cranial nerves II through XII are grossly intact. No deviation of the angle of mouth. No cerebellar signs. MOTOR: Left upper and lower extremity strength is 5 out of 5, right upper extremity motor is 3 to 4 out of 5, right lower extremity motor is 3 to 4 out of 5. Sensory is intact. Reflexes are 2+. No Babinski sign. No cerebellar signs.  EXTREMITIES: No edema. No cyanosis. No clubbing.  SKIN: No lesions. No acne or rashes, warm to touch. Normal turgor.  MUSCULOSKELETAL: No joint effusion or tenderness.   LABORATORY DATA: Glucose 95, sodium 140, potassium 3.7, BUN 17, creatinine 0.63, GFR greater than 60, calcium 8.2, magnesium 1.9. WBC 6.9, hemoglobin 13.4, hematocrit 39.5, platelet count 233,000. CSF analysis: Glucose is 54 which is normal. Protein is 37, which is also normal. PT 12.8, INR 0.9. Urinalysis is negative, leukocyte esterase and nitrites are negative. A CT of the head without contrast has revealed no evidence of acute abnormality in the brain. There is a soft tissue density material in the right sphenoid sinus and consistent with sinusitis.   ASSESSMENT AND PLAN:  1. 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   PLAN:  A. To give her aspirin 325 mg p.o. once daily. A statin once daily.  B. Neuro checks. Neurology consult is placed.  C. Stroke work-up with MRI of the brain, carotid Dopplers and 2-D echocardiogram.  D. Will check fasting lipid panel.  E. Consult  Physical Therapy regarding right-sided weakness.  F. Case management regarding discharge planning.   2.. Acute sinusitis. We will start antibiotics.  3. Hypertension. Blood pressure is elevated but will allow permissive hypertension for adequate cerebral perfusion at this time.  4. Hyperlipidemia. Check fasting lipid panel.  5. Chronic pedal edema. We will continue Lasix. We will provide GI and DVT prophylaxis.   CODE STATUS: She is FULL CODE.   Please follow up on the CSF results.   The findings and plan of care were discussed in detail with the patient. She is aware of the plan. The patient will be turned over to Dr. Eustaquio Boydenejan-Sie's group in a.m.  TOTAL TIME SPENT: 50 minutes.  ____________________________ Ramonita LabAruna Alle Difabio, MD ag:jm D: 05/24/2012 00:31:28 ET T: 05/24/2012 10:10:29 ET JOB#: [id number]  cc: Ramonita LabAruna Kinzy Weyers, MD, <Dictator> Ramonita LabARUNA Loris Seelye MD ELECTRONICALLY SIGNED 06/05/2012 22:38

## 2014-08-28 NOTE — Op Note (Signed)
PATIENT NAME:  Kathy GammonWRENN, Trinia B MR#:  [id number] DATE OF BIRTH:  07/02/1945  DATE OF PROCEDURE:  11/11/2013  PREOPERATIVE DIAGNOSIS:  Senile cataract of the left eye.  POSTOPERATIVE DIAGNOSIS:  Senile cataract of the left eye.  PROCEDURE:  Phacoemulsification with posterior chamber intraocular lens placement of the left eye.   LENS:  [id number] 17.5-diopter posterior chamber Toric intraocular lens with 3.75 diopters of cylindrical power placed at axis at 80 degrees.  ULTRASOUND TIME:  17 % of 1 minute 22 seconds.  CDE 14.2  SURGEON:  Italyhad Michae Grimley, MD  ANESTHESIA:  Topical with tetracaine drops and 2% Xylocaine jelly.  COMPLICATIONS:  None.  DESCRIPTION OF PROCEDURE:  The patient was identified in the holding room and transported to the operating suite and placed in upright position.  Xylocaine gel was placed into the eye and then the eye was prepped and draped in the usual sterile ophthalmic fashion.  The operating microscope was placed into position.   A clear-corneal paracentesis incision was made at the 1:30 o'clock position.  The anterior chamber was filled with Viscoat.  A 2.4-millimeter near clear corneal incision was then made at the 10:30 o'clock position.  A cystotome and capsulorrhexis forceps were then used to make a curvilinear capsulorrhexis.  Hydrodissect and hydrodelineation were then performed using balanced salt solution.   Phacoemulsification was then used in stop and chop fashion to remove the lens, nucleus and epinucleus.  The remaining cortex was aspirated using the irrigation and aspiration handpiece.  Provisc viscoelastic was then placed into the capsular bag to distend it for lens placement.  The Verion digital marker was used to align the implant at the intended axis.   A 17.5-diopter lens was then injected into the capsular bag.  It was rotated clockwise until the axis marks on the lens were approximately 15 degrees in the counterclockwise direction to the 80 degree  axis mark on the cornea.  The viscoelastic was aspirated from the eye using the irrigation aspiration handpiece.  Then, a Sinskey hook through the sideport incision was used to rotate the lens in a clockwise direction until the axis markings of the intraocular lens were lined up with the axis markings on the cornea.  Balanced salt solution was then used to hydrate the wounds.   Miostat was placed into the anterior chamber to constrict the pupil.  0.1 mL of cefuroxime 10 mg/mL were injected into the anterior chamber for a dose of 1 mg of intracameral antibiotic at the completion of the case. The eye was noted to have a physiologic pressure and there was no wound leak noted.  Topical Vigamox drops and erythromycin ointment were applied to the eye.  The patient was taken to the recovery room in stable condition having had no complications of anesthesia or surgery.  ____________________________ Deirdre Evenerhadwick R. Camree Wigington, MD crb:dd D: 11/11/2013 15:23:00 ET T: 11/12/2013 05:35:50 ET JOB#: [id number]  cc: Deirdre Evenerhadwick R. Brayten Komar, MD, <Dictator> Lockie MolaHADWICK Cythina Mickelsen MD ELECTRONICALLY SIGNED 11/18/2013 12:42

## 2014-11-02 DIAGNOSIS — M51369 Other intervertebral disc degeneration, lumbar region without mention of lumbar back pain or lower extremity pain: Secondary | ICD-10-CM | POA: Insufficient documentation

## 2014-11-02 DIAGNOSIS — M7072 Other bursitis of hip, left hip: Secondary | ICD-10-CM | POA: Insufficient documentation

## 2014-11-02 DIAGNOSIS — M5136 Other intervertebral disc degeneration, lumbar region: Secondary | ICD-10-CM | POA: Insufficient documentation

## 2015-03-31 ENCOUNTER — Encounter: Payer: Self-pay | Admitting: Emergency Medicine

## 2015-03-31 ENCOUNTER — Emergency Department
Admission: EM | Admit: 2015-03-31 | Discharge: 2015-03-31 | Disposition: A | Discharge status: Home / Self Care | Payer: Medicare Other | Attending: Emergency Medicine | Admitting: Emergency Medicine

## 2015-03-31 ENCOUNTER — Emergency Department: Payer: Medicare Other

## 2015-03-31 DIAGNOSIS — S8391XA Sprain of unspecified site of right knee, initial encounter: Secondary | ICD-10-CM

## 2015-03-31 DIAGNOSIS — Z79899 Other long term (current) drug therapy: Secondary | ICD-10-CM | POA: Insufficient documentation

## 2015-03-31 DIAGNOSIS — Y9289 Other specified places as the place of occurrence of the external cause: Secondary | ICD-10-CM | POA: Insufficient documentation

## 2015-03-31 DIAGNOSIS — W1842XA Slipping, tripping and stumbling without falling due to stepping into hole or opening, initial encounter: Secondary | ICD-10-CM | POA: Diagnosis not present

## 2015-03-31 DIAGNOSIS — Y998 Other external cause status: Secondary | ICD-10-CM | POA: Insufficient documentation

## 2015-03-31 DIAGNOSIS — I10 Essential (primary) hypertension: Secondary | ICD-10-CM | POA: Insufficient documentation

## 2015-03-31 DIAGNOSIS — S8001XA Contusion of right knee, initial encounter: Secondary | ICD-10-CM | POA: Insufficient documentation

## 2015-03-31 DIAGNOSIS — Y9301 Activity, walking, marching and hiking: Secondary | ICD-10-CM | POA: Insufficient documentation

## 2015-03-31 DIAGNOSIS — S8991XA Unspecified injury of right lower leg, initial encounter: Secondary | ICD-10-CM | POA: Diagnosis present

## 2015-03-31 DIAGNOSIS — Z880 Allergy status to penicillin: Secondary | ICD-10-CM | POA: Diagnosis not present

## 2015-03-31 DIAGNOSIS — Z7951 Long term (current) use of inhaled steroids: Secondary | ICD-10-CM | POA: Insufficient documentation

## 2015-03-31 MED ORDER — MELOXICAM 15 MG PO TABS: 15.0000 mg | ORAL_TABLET | Freq: Every day | ORAL | Status: DC

## 2015-03-31 MED ORDER — OXYCODONE HCL 5 MG PO TABS
5.0000 mg | ORAL_TABLET | Freq: Once | ORAL | Status: AC
  Administered 2015-03-31: 5 mg via ORAL
  Filled 2015-03-31: qty 1

## 2015-03-31 MED ORDER — OXYCODONE HCL 5 MG PO TABS: 5.0000 mg | ORAL_TABLET | Freq: Three times a day (TID) | ORAL | Status: DC | PRN

## 2015-03-31 NOTE — ED Provider Notes (Signed)
[facility] Emergency Department Provider Note  ____________________________________________  Time seen: Approximately 9:27 PM  I have reviewed the triage vital signs and the nursing notes.   HISTORY  Chief Complaint Knee Injury    HPI Kathy Ashley is a 69 y.o. female resents emergency department complaining of right knee pain. She states that she was walking, stepped in a hole, fell landing on the anterior aspect of her right knee. She denies any previous history of injury or surgery to same knee. She presents with significant pain to the anterior aspect as well as swelling to knee. The patient no numbness or tingling distal to injury site. She was able to put weight on extremity but with extreme pain. She said states the pain is sharp, constant, worse with movement or weightbearing.   Past Medical History  Diagnosis Date  . Hypertension 2005  . Allergy   . Hepatitis A 2016  . Collapsed lung 2016  . Pneumonia 2016    There are no active problems to display for this patient.   Past Surgical History  Procedure Laterality Date  . Breast biopsy Right 2002  . Cesarean section    . Dilation and curettage of uterus    . Colonoscopy  [id number]    Current Outpatient Rx  Name  Route  Sig  Dispense  Refill  . albuterol (PROVENTIL HFA;VENTOLIN HFA) 108 (90 BASE) MCG/ACT inhaler   Inhalation   Inhale 2 puffs into the lungs 2 (two) times daily.         Marland Kitchen albuterol (PROVENTIL) (2.5 MG/3ML) 0.083% nebulizer solution   Nebulization   Take 2.5 mg by nebulization every 6 (six) hours as needed for wheezing or shortness of breath.         Marland Kitchen amLODipine (NORVASC) 5 MG tablet   Oral   Take 5 mg by mouth daily.          Marland Kitchen aspirin 81 MG tablet   Oral   Take 81 mg by mouth daily.         Marland Kitchen escitalopram (LEXAPRO) 20 MG tablet   Oral   Take 20 mg by mouth daily.          . furosemide (LASIX) 20 MG tablet   Oral   Take 20 mg by mouth as  needed.          Marland Kitchen LORazepam (ATIVAN) 1 MG tablet   Oral   Take 1 mg by mouth every 8 (eight) hours as needed.          . meloxicam (MOBIC) 15 MG tablet   Oral   Take 1 tablet (15 mg total) by mouth daily.   30 tablet   0   . metoprolol succinate (TOPROL-XL) 50 MG 24 hr tablet   Oral   Take 50 mg by mouth daily.          . montelukast (SINGULAIR) 10 MG tablet   Oral   Take 10 mg by mouth daily.      4   . Omega-3 Fatty Acids (FISH OIL) 1200 MG CAPS   Oral   Take 1 capsule by mouth daily.         Marland Kitchen oxyCODONE (ROXICODONE) 5 MG immediate release tablet   Oral   Take 1 tablet (5 mg total) by mouth every 8 (eight) hours as needed.   20 tablet   0   . pantoprazole (PROTONIX) 40 MG tablet   Oral   Take 1 tablet  by mouth daily.           Allergies Penicillins  Family History  Problem Relation Age of Onset  . Diabetes Father     Social History Social History  Substance Use Topics  . Smoking status: Never Smoker   . Smokeless tobacco: Never Used  . Alcohol Use: Yes     Comment: wine occasionally    Review of Systems Constitutional: No fever/chills Eyes: No visual changes. ENT: No sore throat. Cardiovascular: Denies chest pain. Respiratory: Denies shortness of breath. Gastrointestinal: No abdominal pain.  No nausea, no vomiting.  No diarrhea.  No constipation. Genitourinary: Negative for dysuria. Musculoskeletal: Negative for back pain. His right knee pain. Skin: Negative for rash. Neurological: Negative for headaches, focal weakness or numbness.  10-point ROS otherwise negative.  ____________________________________________   PHYSICAL EXAM:  VITAL SIGNS: ED Triage Vitals  Enc Vitals Group     BP 03/31/15 2057 118/62 mmHg     Pulse Rate 03/31/15 2057 93     Resp 03/31/15 2057 18     Temp 03/31/15 2057 98.1 F (36.7 C)     Temp Source 03/31/15 2057 Oral     SpO2 03/31/15 2057 97 %     Weight 03/31/15 2057 202 lb (91.627 kg)     Height  03/31/15 2057  (1.727 m)     Head Cir --      Peak Flow --      Pain Score 03/31/15 2058 9     Pain Loc --      Pain Edu? --      Excl. in GC? --     Constitutional: Alert and oriented. Well appearing and in no acute distress. Eyes: Conjunctivae are normal. PERRL. EOMI. Head: Atraumatic. Nose: No congestion/rhinnorhea. Mouth/Throat: Mucous membranes are moist.  Oropharynx non-erythematous. Neck: No stridor.   Cardiovascular: Normal rate, regular rhythm. Grossly normal heart sounds.  Good peripheral circulation. Respiratory: Normal respiratory effort.  No retractions. Lungs CTAB. Gastrointestinal: Soft and nontender. No distention. No abdominal bruits. No CVA tenderness. Musculoskeletal: Edema is noted to right knee when compared to left. Contusion is noted to the anterior medial aspect of knee. No obvious deformity to knee. Patient is diffusely tender to palpation over the superior and inferior aspects of the etiology knee. No palpable deformity. No ballottement. Lockman's, varus, valgus are negative. Sensation and pulses intact distally. Neurologic:  Normal speech and language. No gross focal neurologic deficits are appreciated. No gait instability. Skin:  Skin is warm, dry and intact. No rash noted. Psychiatric: Mood and affect are normal. Speech and behavior are normal.  ____________________________________________   LABS (all labs ordered are listed, but only abnormal results are displayed)  Labs Reviewed - No data to display ____________________________________________  EKG   ____________________________________________  RADIOLOGY  Right knee x-ray Impression: No evidence of acute fracture or subluxation. Large soft tissue hematoma. ____________________________________________   PROCEDURES  Procedure(s) performed: None  Critical Care performed: No  ____________________________________________   INITIAL IMPRESSION / ASSESSMENT AND PLAN / ED  COURSE  Pertinent labs & imaging results that were available during my care of the patient were reviewed by me and considered in my medical decision making (see chart for details).  Agents history, symptoms, physical exam are taken into consideration for diagnosis. Advised patient of findings and radiological imaging. Patient verbalizes understanding of diagnosis and treatment plan to include pain medication and anti-inflammatories. Patient is instructed in the RICE method to include rest, ice, compression, elevation. Patient verbalizes  understanding of diagnosis and treatment plan and verbalizes compliance with same. Patient will follow-up with orthopedics should symptoms persist or worsen. ____________________________________________   FINAL CLINICAL IMPRESSION(S) / ED DIAGNOSES  Final diagnoses:  Knee contusion, right, initial encounter  Knee sprain, right, initial encounter      Racheal PatchesJonathan D Tylor Courtwright, PA-C 03/31/15 2350  Jennye MoccasinBrian S Quigley, MD 04/01/15 [phone]

## 2015-03-31 NOTE — ED Notes (Signed)
Pt arrived to the ED via wheel chair for complaints of right knee pain after sustaining a fall. Pt denies LOC. Significant swelling on the right knee with bruising. Pt is AOx4 in no apparent distress.

## 2015-03-31 NOTE — Discharge Instructions (Signed)
Knee Sprain A knee sprain is a tear in one of the strong, fibrous tissues that connect the bones (ligaments) in your knee. The severity of the sprain depends on how much of the ligament is torn. The tear can be either partial or complete. CAUSES  Often, sprains are a result of a fall or injury. The force of the impact causes the fibers of your ligament to stretch too much. This excess tension causes the fibers of your ligament to tear. SIGNS AND SYMPTOMS  You may have some loss of motion in your knee. Other symptoms include:  Bruising.  Pain in the knee area.  Tenderness of the knee to the touch.  Swelling. DIAGNOSIS  To diagnose a knee sprain, your health care provider will physically examine your knee. Your health care provider may also suggest an X-ray exam of your knee to make sure no bones are broken. TREATMENT  If your ligament is only partially torn, treatment usually involves keeping the knee in a fixed position (immobilization) or bracing your knee for activities that require movement for several weeks. To do this, your health care provider will apply a bandage, cast, or splint to keep your knee from moving and to support your knee during movement until it heals. For a partially torn ligament, the healing process usually takes 4-6 weeks. If your ligament is completely torn, depending on which ligament it is, you may need surgery to reconnect the ligament to the bone or reconstruct it. After surgery, a cast or splint may be applied and will need to stay on your knee for 4-6 weeks while your ligament heals. HOME CARE INSTRUCTIONS  Keep your injured knee elevated to decrease swelling.  To ease pain and swelling, apply ice to the injured area:  Put ice in a plastic bag.  Place a towel between your skin and the bag.  Leave the ice on for 20 minutes, 2-3 times a day.  Only take medicine for pain as directed by your health care provider.  Do not leave your knee unprotected until  pain and stiffness go away (usually 4-6 weeks).  If you have a cast or splint, do not allow it to get wet. If you have been instructed not to remove it, cover it with a plastic bag when you shower or bathe. Do not swim.  Your health care provider may suggest exercises for you to do during your recovery to prevent or limit permanent weakness and stiffness. SEEK IMMEDIATE MEDICAL CARE IF:  Your cast or splint becomes damaged.  Your pain becomes worse.  You have significant pain, swelling, or numbness below the cast or splint. MAKE SURE YOU:  Understand these instructions.  Will watch your condition.  Will get help right away if you are not doing well or get worse.   This information is not intended to replace advice given to you by your health care provider. Make sure you discuss any questions you have with your health care provider.   Document Released: 04/23/2005 Document Revised: 05/14/2014 Document Reviewed: 12/03/2012 Elsevier Interactive Patient Education 2016 [employer] Inc[employer]  Periosteal Hematoma Periosteal hematoma (bone bruise) is a localized, tender, raised area close to the bone. It can occur from a small hidden fracture of the bone, following surgery, or from other trauma to the area. It typically occurs in bones located close to the surface of the skin, such as the shin, knee, and heel bone. Although it may take 2 or more weeks to completely heal, bone bruises typically  are not associated with permanent or serious damage to the bone. If you are taking blood thinners, you may be at greater risk for such injuries.  CAUSES  A bone bruise is usually caused by high-impact trauma to the bone, but it can be caused by sports injuries or twisting injuries. SIGNS AND SYMPTOMS   Severe pain around the injured area that typically lasts longer than a normal bruise.  Difficulty using the bruised area.  Tender, raised area close to the bone.  Discoloration or swelling of the bruised  area. DIAGNOSIS  You may need an MRI of the injured area to confirm a bone bruise if your health care provider feels it is necessary. A regular X-ray will not detect a bone bruise, but it will detect a broken bone (fracture). An X-ray may be taken to rule out any fractures. TREATMENT  Often, the best treatment for a bone bruise is resting, icing, and applying cold compresses to the injured area. Over-the-counter medicines may also be recommended for pain control. HOME CARE INSTRUCTIONS  Some things you can do to improve the condition are:   Rest and elevate the area of injury as long as it is very tender or swollen.  Apply ice to the injured area:  Put ice in a plastic bag.  Place a towel between your skin and the bag.  Leave the ice on for 20 minutes, 2-3 times a day.  Use an elastic wrap to reduce swelling and protect the injured area. Make sure it is not applied too tightly. If the area around the wrap becomes cold or blue, the wrap is too tight. Wrap it more loosely.  For activity:  Follow your health care provider's instructions about whether walking with crutches is required. This will depend on how serious your condition is.  Start weight bearing gradually on the bruised part.  Continue to use crutches or a cane until you can stand without causing pain, or as instructed.  If a plaster splint was applied:  Wear the splint until you are seen for a follow-up exam.  Rest it on nothing harder than a pillow the first 24 hours.  Do not put weight on it.  Do not get it wet. You may take it off to take a shower or bath.  You may have been given an elastic bandage to use with or without the plaster splint. The splint is too tight if you have numbness or tingling, or if the skin around the bandage becomes cold and blue. Adjust the bandage to make it comfortable.  If an air splint was applied:  You may alter the amount of air in the splint as needed for comfort.  You may take  it off at night and to take a shower or bath.  If the injury was in either leg, wiggle your toes in the splint several times per day if you are able.  Only take over-the-counter or prescription medicines for pain, discomfort, or fever as directed by your health care provider.  Keep all follow-up visits with your health care provider. This includes any orthopedic referrals, physical therapy, and rehabilitation. Any delay in getting necessary care could result in a delay or failure of the bones to heal. SEEK MEDICAL CARE IF:   You have an increase in bruising, swelling, tenderness, heat, or pain over your injury.  You notice coldness of your toes that does not improve after removing a splint or bandage.  Your pain is not lessened after you  take medicine.  You have increased difficulty bearing weight on the injured leg, if the injury is in either leg. SEEK IMMEDIATE MEDICAL CARE IF:   You have severe pain near the injured area or severe pain with stretching.  You have increased swelling that resulted in a tense, hard area or a loss of sensation in the area of the injury.  You have pale, cool skin below the area of the injury (in an extremity) that does not go away after removing a splint or bandage. MAKE SURE YOU:   Understand these instructions.  Will watch your condition.  Will get help right away if you are not doing well or get worse.   This information is not intended to replace advice given to you by your health care provider. Make sure you discuss any questions you have with your health care provider.   Document Released: 05/31/2004 Document Revised: 02/11/2013 Document Reviewed: 10/10/2012 Elsevier Interactive Patient Education 2016 Else[employer] Inc.  Knee Sprain A knee sprain is a tear in one of the strong, fibrous tissues that connect the bones (ligaments) in your knee. The severity of the sprain depends on how much of the ligament is torn. The tear can be either partial or  complete. CAUSES  Often, sprains are a result of a fall or injury. The force of the impact causes the fibers of your ligament to stretch too much. This excess tension causes the fibers of your ligament to tear. SIGNS AND SYMPTOMS  You may have some loss of motion in your knee. Other symptoms include:  Bruising.  Pain in the knee area.  Tenderness of the knee to the touch.  Swelling. DIAGNOSIS  To diagnose a knee sprain, your health care provider will physically examine your knee. Your health care provider may also suggest an X-ray exam of your knee to make sure no bones are broken. TREATMENT  If your ligament is only partially torn, treatment usually involves keeping the knee in a fixed position (immobilization) or bracing your knee for activities that require movement for several weeks. To do this, your health care provider will apply a bandage, cast, or splint to keep your knee from moving and to support your knee during movement until it heals. For a partially torn ligament, the healing process usually takes 4-6 weeks. If your ligament is completely torn, depending on which ligament it is, you may need surgery to reconnect the ligament to the bone or reconstruct it. After surgery, a cast or splint may be applied and will need to stay on your knee for 4-6 weeks while your ligament heals. HOME CARE INSTRUCTIONS  Keep your injured knee elevated to decrease swelling.  To ease pain and swelling, apply ice to the injured area:  Put ice in a plastic bag.  Place a towel between your skin and the bag.  Leave the ice on for 20 minutes, 2-3 times a day.  Only take medicine for pain as directed by your health care provider.  Do not leave your knee unprotected until pain and stiffness go away (usually 4-6 weeks).  If you have a cast or splint, do not allow it to get wet. If you have been instructed not to remove it, cover it with a plastic bag when you shower or bathe. Do not swim.  Your  health care provider may suggest exercises for you to do during your recovery to prevent or limit permanent weakness and stiffness. SEEK IMMEDIATE MEDICAL CARE IF:  Your cast or splint  becomes damaged.  Your pain becomes worse.  You have significant pain, swelling, or numbness below the cast or splint. MAKE SURE YOU:  Understand these instructions.  Will watch your condition.  Will get help right away if you are not doing well or get worse.   This information is not intended to replace advice given to you by your health care provider. Make sure you discuss any questions you have with your health care provider.   Document Released: 04/23/2005 Document Revised: 05/14/2014 Document Reviewed: 12/03/2012 Elsevier Interactive Patient Education [employer]

## 2015-05-12 ENCOUNTER — Other Ambulatory Visit: Payer: Self-pay | Admitting: *Deleted

## 2015-05-12 DIAGNOSIS — Z1231 Encounter for screening mammogram for malignant neoplasm of breast: Secondary | ICD-10-CM

## 2015-05-19 ENCOUNTER — Encounter: Payer: Self-pay | Admitting: *Deleted

## 2015-07-07 ENCOUNTER — Ambulatory Visit: Payer: Medicare Other

## 2015-07-08 ENCOUNTER — Ambulatory Visit: Payer: Medicare Other

## 2015-07-14 ENCOUNTER — Ambulatory Visit: Payer: Medicare Other | Admitting: General Surgery

## 2015-07-25 ENCOUNTER — Ambulatory Visit: Payer: Medicare Other

## 2015-08-01 ENCOUNTER — Ambulatory Visit
Admission: RE | Admit: 2015-08-01 | Discharge: 2015-08-01 | Disposition: A | Discharge status: Home / Self Care | Payer: Medicare Other | Source: Ambulatory Visit | Attending: General Surgery | Admitting: General Surgery

## 2015-08-01 ENCOUNTER — Other Ambulatory Visit: Payer: Self-pay | Admitting: General Surgery

## 2015-08-01 DIAGNOSIS — Z1231 Encounter for screening mammogram for malignant neoplasm of breast: Secondary | ICD-10-CM | POA: Insufficient documentation

## 2015-08-02 ENCOUNTER — Ambulatory Visit: Payer: Medicare Other | Admitting: General Surgery

## 2015-08-08 ENCOUNTER — Ambulatory Visit: Payer: Medicare Other | Admitting: General Surgery

## 2015-08-15 ENCOUNTER — Encounter: Payer: Self-pay | Admitting: General Surgery

## 2015-08-15 ENCOUNTER — Encounter: Payer: Medicare Other | Admitting: General Surgery

## 2015-08-15 NOTE — Progress Notes (Signed)
This encounter was created in error - please disregard.

## 2015-08-15 NOTE — Progress Notes (Signed)
Patient ID: Kathy KusterCherry Blanchard Tony, female   DOB: 10/24/1945, 70 y.o.   MRN: [id number]  Chief Complaint  Patient presents with  . Follow-up    mammogram    HPI Kathy Ashley is a 70 y.o. female  HPI  Past Medical History  Diagnosis Date  . Hypertension 2005  . Allergy   . Hepatitis A 2016  . Collapsed lung 2016  . Pneumonia 2016    Past Surgical History  Procedure Laterality Date  . Cesarean section    . Dilation and curettage of uterus    . Colonoscopy  [id number],2015  . Breast biopsy Right 2002    neg    Family History  Problem Relation Age of Onset  . Diabetes Father   . Breast cancer Neg Hx     Social History Social History  Substance Use Topics  . Smoking status: Never Smoker   . Smokeless tobacco: Never Used  . Alcohol Use: Yes     Comment: wine occasionally    Allergies  Allergen Reactions  . Penicillins Rash    Current Outpatient Prescriptions  Medication Sig Dispense Refill  . albuterol (PROVENTIL HFA;VENTOLIN HFA) 108 (90 BASE) MCG/ACT inhaler Inhale 2 puffs into the lungs 2 (two) times daily.    . furosemide (LASIX) 20 MG tablet Take 20 mg by mouth as needed.     . gabapentin (NEURONTIN) 300 MG capsule     . losartan (COZAAR) 25 MG tablet     . metoprolol succinate (TOPROL-XL) 50 MG 24 hr tablet Take 100 mg by mouth daily.     . montelukast (SINGULAIR) 10 MG tablet Take 10 mg by mouth daily.  4  . Omega-3 Fatty Acids (FISH OIL) 1200 MG CAPS Take 1 capsule by mouth daily.    . traZODone (DESYREL) 100 MG tablet     . Venlafaxine HCl 75 MG TB24     . albuterol (PROVENTIL) (2.5 MG/3ML) 0.083% nebulizer solution Take 2.5 mg by nebulization every 6 (six) hours as needed for wheezing or shortness of breath.     No current facility-administered medications for this visit.    Review of Systems Review of Systems    Blood pressure 132/76, pulse 74, resp. rate 14, height 5\' 8"  (1.727 m), weight 206 lb (93.441 kg).  Physical Exam Physical Exam   Pulmonary/Chest: Effort normal and breath sounds normal. Right breast exhibits no inverted nipple, no mass, no nipple discharge, no skin change and no tenderness. Left breast exhibits no inverted nipple, no mass, no nipple discharge, no skin change and no tenderness.  Lymphadenopathy:    She has no axillary adenopathy.    Data Reviewed   Assessment        Plan           Ples SpecterQualls, Jessica 08/15/2015, 3:22 PM

## 2015-08-23 ENCOUNTER — Ambulatory Visit: Payer: Medicare Other | Admitting: General Surgery

## 2015-10-12 ENCOUNTER — Encounter: Payer: Self-pay | Admitting: *Deleted

## 2016-02-07 ENCOUNTER — Other Ambulatory Visit: Payer: Self-pay | Admitting: Cardiovascular Disease

## 2016-02-09 ENCOUNTER — Ambulatory Visit
Admission: RE | Admit: 2016-02-09 | Discharge: 2016-02-09 | Disposition: A | Discharge status: Home / Self Care | Payer: Medicare Other | Source: Ambulatory Visit | Attending: Cardiovascular Disease | Admitting: Cardiovascular Disease

## 2016-02-09 ENCOUNTER — Encounter
Admission: RE | Disposition: A | Discharge status: Home / Self Care | Payer: Self-pay | Source: Ambulatory Visit | Attending: Cardiovascular Disease

## 2016-02-09 ENCOUNTER — Ambulatory Visit: Payer: Medicare Other | Admitting: Anesthesiology

## 2016-02-09 ENCOUNTER — Encounter: Payer: Self-pay | Admitting: *Deleted

## 2016-02-09 DIAGNOSIS — Z79891 Long term (current) use of opiate analgesic: Secondary | ICD-10-CM | POA: Diagnosis not present

## 2016-02-09 DIAGNOSIS — Z7901 Long term (current) use of anticoagulants: Secondary | ICD-10-CM | POA: Diagnosis not present

## 2016-02-09 DIAGNOSIS — I340 Nonrheumatic mitral (valve) insufficiency: Secondary | ICD-10-CM | POA: Diagnosis not present

## 2016-02-09 DIAGNOSIS — Z7951 Long term (current) use of inhaled steroids: Secondary | ICD-10-CM | POA: Diagnosis not present

## 2016-02-09 DIAGNOSIS — I10 Essential (primary) hypertension: Secondary | ICD-10-CM | POA: Insufficient documentation

## 2016-02-09 DIAGNOSIS — I2510 Atherosclerotic heart disease of native coronary artery without angina pectoris: Secondary | ICD-10-CM | POA: Insufficient documentation

## 2016-02-09 DIAGNOSIS — I484 Atypical atrial flutter: Secondary | ICD-10-CM | POA: Diagnosis not present

## 2016-02-09 DIAGNOSIS — Z79899 Other long term (current) drug therapy: Secondary | ICD-10-CM | POA: Insufficient documentation

## 2016-02-09 DIAGNOSIS — Z9889 Other specified postprocedural states: Secondary | ICD-10-CM | POA: Insufficient documentation

## 2016-02-09 DIAGNOSIS — B159 Hepatitis A without hepatic coma: Secondary | ICD-10-CM | POA: Diagnosis not present

## 2016-02-09 DIAGNOSIS — E782 Mixed hyperlipidemia: Secondary | ICD-10-CM | POA: Diagnosis not present

## 2016-02-09 DIAGNOSIS — Z8249 Family history of ischemic heart disease and other diseases of the circulatory system: Secondary | ICD-10-CM | POA: Diagnosis not present

## 2016-02-09 DIAGNOSIS — I4891 Unspecified atrial fibrillation: Secondary | ICD-10-CM | POA: Diagnosis present

## 2016-02-09 DIAGNOSIS — J45909 Unspecified asthma, uncomplicated: Secondary | ICD-10-CM | POA: Diagnosis not present

## 2016-02-09 DIAGNOSIS — Z880 Allergy status to penicillin: Secondary | ICD-10-CM | POA: Diagnosis not present

## 2016-02-09 DIAGNOSIS — Z9289 Personal history of other medical treatment: Secondary | ICD-10-CM | POA: Insufficient documentation

## 2016-02-09 HISTORY — DX: Other seasonal allergic rhinitis: J30.2

## 2016-02-09 HISTORY — PX: CARDIOVERSION: SHX1299

## 2016-02-09 HISTORY — PX: ELECTROPHYSIOLOGIC STUDY: SHX172A

## 2016-02-09 HISTORY — DX: Unspecified atrial fibrillation: I48.91

## 2016-02-09 SURGERY — Surgical Case
Anesthesia: *Unknown

## 2016-02-09 SURGERY — CARDIOVERSION (CATH LAB)
Anesthesia: General

## 2016-02-09 MED ORDER — PROPOFOL 10 MG/ML IV BOLUS
INTRAVENOUS | Status: DC | PRN
  Administered 2016-02-09: 60 mg via INTRAVENOUS

## 2016-02-09 MED ORDER — SODIUM CHLORIDE 0.9 % IV SOLN
INTRAVENOUS | Status: DC | PRN
  Administered 2016-02-09: 08:00:00 via INTRAVENOUS

## 2016-02-09 NOTE — Anesthesia Postprocedure Evaluation (Signed)
Anesthesia Post Note  Patient: Kathy KusterCherry Blanchard Ashley  Procedure(s) Performed: Procedure(s) (LRB): CARDIOVERSION (N/A)  Patient location during evaluation: PACU Anesthesia Type: General Level of consciousness: awake Pain management: pain level controlled Vital Signs Assessment: post-procedure vital signs reviewed and stable Respiratory status: spontaneous breathing Cardiovascular status: stable Anesthetic complications: no    Last Vitals:  Vitals:   02/09/16 0815 02/09/16 0830  BP: 104/61 92/61  Pulse: 68 71  Resp: 17 14  Temp:      Last Pain:  Vitals:   02/09/16 0648  TempSrc: Oral                 VAN STAVEREN,Marlean Mortell

## 2016-02-09 NOTE — Anesthesia Procedure Notes (Signed)
Date/Time: 02/09/2016 7:56 AM Performed by: Junious SilkNOLES, Deshawnda Acrey Pre-anesthesia Checklist: Patient identified, Emergency Drugs available, Suction available, Patient being monitored and Timeout performed Oxygen Delivery Method: Nasal cannula

## 2016-02-09 NOTE — Procedures (Signed)
  NAME:  Sallyanne KusterCherry Blanchard Mawson   MRN: [id number] DOB:  07/20/1945   ADMIT DATE: 02/09/2016  Procedure: Electrical Cardioversion Indications:  Atrial Fibrillation  Procedure Details:    Time Out: Verified patient identification, verified procedure, site/side was marked, verified correct patient position, special equipment/implants available, medications/allergies/relevent history reviewed, required imaging and test results available.    Patient placed on cardiac monitor, pulse oximetry, supplemental oxygen as necessary.  Sedation given:  Pacer pads placed   Cardioverted . 200 j Cardioverted at 200 j  Evaluation: Findings: Post procedure EKG shows:  Complications:  Patient did well.     Laurier NancyKHAN,Nessa Ramaker A, M.D. [facility]   02/09/2016 3:28 PM

## 2016-02-09 NOTE — Transfer of Care (Signed)
Immediate Anesthesia Transfer of Care Note  Patient: Kathy Ashley  Procedure(s) Performed: Procedure(s): CARDIOVERSION (N/A)  Patient Location: PACU and Cath Lab  Anesthesia Type:General  Level of Consciousness: awake, alert  and oriented  Airway & Oxygen Therapy: Patient Spontanous Breathing and Patient connected to nasal cannula oxygen  Post-op Assessment: Report given to RN and Post -op Vital signs reviewed and stable  Post vital signs: Reviewed and stable  Last Vitals:  Vitals:   02/09/16 0648  BP: 94/75  Pulse: (!) 109  Temp: 36.6 C    Last Pain:  Vitals:   02/09/16 0648  TempSrc: Oral         Complications: No apparent anesthesia complications

## 2016-02-09 NOTE — Anesthesia Preprocedure Evaluation (Signed)
Anesthesia Evaluation  Patient identified by MRN, date of birth, ID band Patient awake    Reviewed: Allergy & Precautions, NPO status , Patient's Chart, lab work & pertinent test results  Airway Mallampati: II       Dental  (+) Teeth Intact   Pulmonary    breath sounds clear to auscultation       Cardiovascular Exercise Tolerance: Good hypertension, Pt. on home beta blockers + dysrhythmias Atrial Fibrillation  Rhythm:Irregular Rate:Abnormal     Neuro/Psych negative neurological ROS     GI/Hepatic negative GI ROS, (+) Hepatitis -, A  Endo/Other  negative endocrine ROS  Renal/GU negative Renal ROS     Musculoskeletal   Abdominal Normal abdominal exam  (+)   Peds  Hematology negative hematology ROS (+)   Anesthesia Other Findings   Reproductive/Obstetrics                             Anesthesia Physical Anesthesia Plan  ASA: III  Anesthesia Plan: General   Post-op Pain Management:    Induction: Intravenous  Airway Management Planned: Natural Airway and Nasal Cannula  Additional Equipment:   Intra-op Plan:   Post-operative Plan:   Informed Consent: I have reviewed the patients History and Physical, chart, labs and discussed the procedure including the risks, benefits and alternatives for the proposed anesthesia with the patient or authorized representative who has indicated his/her understanding and acceptance.     Plan Discussed with: CRNA  Anesthesia Plan Comments:         Anesthesia Quick Evaluation

## 2016-07-19 ENCOUNTER — Inpatient Hospital Stay
Admission: AD | Admit: 2016-07-19 | Discharge: 2016-07-22 | DRG: 603 | Disposition: A | Discharge status: Home / Self Care | Payer: Medicare Other | Source: Ambulatory Visit | Attending: Internal Medicine | Admitting: Internal Medicine

## 2016-07-19 ENCOUNTER — Emergency Department
Admission: EM | Admit: 2016-07-19 | Discharge: 2016-07-19 | Discharge status: Against Medical Advice | Payer: Medicare Other

## 2016-07-19 ENCOUNTER — Inpatient Hospital Stay: Payer: Medicare Other

## 2016-07-19 ENCOUNTER — Encounter: Payer: Self-pay | Admitting: Specialist

## 2016-07-19 DIAGNOSIS — L0390 Cellulitis, unspecified: Secondary | ICD-10-CM | POA: Diagnosis present

## 2016-07-19 DIAGNOSIS — Z79899 Other long term (current) drug therapy: Secondary | ICD-10-CM

## 2016-07-19 DIAGNOSIS — G629 Polyneuropathy, unspecified: Secondary | ICD-10-CM | POA: Diagnosis present

## 2016-07-19 DIAGNOSIS — L03115 Cellulitis of right lower limb: Principal | ICD-10-CM | POA: Diagnosis present

## 2016-07-19 DIAGNOSIS — M79604 Pain in right leg: Secondary | ICD-10-CM | POA: Diagnosis present

## 2016-07-19 DIAGNOSIS — Z8249 Family history of ischemic heart disease and other diseases of the circulatory system: Secondary | ICD-10-CM | POA: Diagnosis not present

## 2016-07-19 DIAGNOSIS — Z880 Allergy status to penicillin: Secondary | ICD-10-CM | POA: Diagnosis not present

## 2016-07-19 DIAGNOSIS — F329 Major depressive disorder, single episode, unspecified: Secondary | ICD-10-CM | POA: Diagnosis present

## 2016-07-19 DIAGNOSIS — I482 Chronic atrial fibrillation: Secondary | ICD-10-CM | POA: Diagnosis present

## 2016-07-19 DIAGNOSIS — M7989 Other specified soft tissue disorders: Secondary | ICD-10-CM

## 2016-07-19 DIAGNOSIS — W19XXXA Unspecified fall, initial encounter: Secondary | ICD-10-CM | POA: Diagnosis present

## 2016-07-19 DIAGNOSIS — Z833 Family history of diabetes mellitus: Secondary | ICD-10-CM

## 2016-07-19 DIAGNOSIS — S8011XA Contusion of right lower leg, initial encounter: Secondary | ICD-10-CM | POA: Diagnosis present

## 2016-07-19 DIAGNOSIS — L02415 Cutaneous abscess of right lower limb: Secondary | ICD-10-CM | POA: Diagnosis present

## 2016-07-19 DIAGNOSIS — I10 Essential (primary) hypertension: Secondary | ICD-10-CM | POA: Diagnosis present

## 2016-07-19 DIAGNOSIS — Z7901 Long term (current) use of anticoagulants: Secondary | ICD-10-CM | POA: Diagnosis not present

## 2016-07-19 LAB — BASIC METABOLIC PANEL
ANION GAP: 8 (ref 5–15)
BUN: 28 mg/dL — ABNORMAL HIGH (ref 6–20)
CALCIUM: 8.4 mg/dL — AB (ref 8.9–10.3)
CO2: 30 mmol/L (ref 22–32)
CREATININE: 1.06 mg/dL — AB (ref 0.44–1.00)
Chloride: 98 mmol/L — ABNORMAL LOW (ref 101–111)
GFR, EST NON AFRICAN AMERICAN: 52 mL/min — AB (ref >60)
Glucose, Bld: 97 mg/dL (ref 65–99)
Potassium: 4.1 mmol/L (ref 3.5–5.1)
Sodium: 136 mmol/L (ref 135–145)

## 2016-07-19 LAB — CBC
HEMATOCRIT: 34.5 % — AB (ref 35.0–47.0)
Hemoglobin: 11.6 g/dL — ABNORMAL LOW (ref 12.0–16.0)
MCH: 30.4 pg (ref 26.0–34.0)
MCHC: 33.8 g/dL (ref 32.0–36.0)
MCV: 89.9 fL (ref 80.0–100.0)
PLATELETS: 224 10*3/uL (ref 150–440)
RBC: 3.83 MIL/uL (ref 3.80–5.20)
RDW: 14.2 % (ref 11.5–14.5)
WBC: 7.5 10*3/uL (ref 3.6–11.0)

## 2016-07-19 MED ORDER — TRAZODONE HCL 100 MG PO TABS
100.0000 mg | ORAL_TABLET | Freq: Every day | ORAL | Status: DC
  Administered 2016-07-19 – 2016-07-21 (×3): 100 mg via ORAL
  Filled 2016-07-19 (×3): qty 1

## 2016-07-19 MED ORDER — MONTELUKAST SODIUM 10 MG PO TABS
10.0000 mg | ORAL_TABLET | Freq: Every day | ORAL | Status: DC
  Administered 2016-07-20 – 2016-07-22 (×2): 10 mg via ORAL
  Filled 2016-07-19 (×2): qty 1

## 2016-07-19 MED ORDER — GABAPENTIN 300 MG PO CAPS
300.0000 mg | ORAL_CAPSULE | Freq: Two times a day (BID) | ORAL | Status: DC
  Administered 2016-07-19 – 2016-07-22 (×5): 300 mg via ORAL
  Filled 2016-07-19 (×5): qty 1

## 2016-07-19 MED ORDER — MORPHINE SULFATE (PF) 2 MG/ML IV SOLN
2.0000 mg | INTRAVENOUS | Status: DC | PRN
  Administered 2016-07-19 – 2016-07-20 (×4): 2 mg via INTRAVENOUS
  Filled 2016-07-19 (×4): qty 1

## 2016-07-19 MED ORDER — ONDANSETRON HCL 4 MG PO TABS: 4.0000 mg | ORAL_TABLET | Freq: Four times a day (QID) | ORAL | Status: DC | PRN

## 2016-07-19 MED ORDER — VENLAFAXINE HCL ER 75 MG PO CP24
75.0000 mg | ORAL_CAPSULE | Freq: Every day | ORAL | Status: DC
  Administered 2016-07-20 – 2016-07-22 (×2): 75 mg via ORAL
  Filled 2016-07-19 (×2): qty 1

## 2016-07-19 MED ORDER — FUROSEMIDE 20 MG PO TABS
20.0000 mg | ORAL_TABLET | Freq: Every day | ORAL | Status: DC
Start: 2016-07-19 — End: 2016-07-22
  Administered 2016-07-20 – 2016-07-22 (×2): 20 mg via ORAL
  Filled 2016-07-19 (×2): qty 1

## 2016-07-19 MED ORDER — ALBUTEROL SULFATE (2.5 MG/3ML) 0.083% IN NEBU: 2.5000 mg | INHALATION_SOLUTION | Freq: Four times a day (QID) | RESPIRATORY_TRACT | Status: DC | PRN

## 2016-07-19 MED ORDER — LOSARTAN POTASSIUM 25 MG PO TABS
25.0000 mg | ORAL_TABLET | Freq: Every day | ORAL | Status: DC
  Filled 2016-07-19 (×2): qty 1

## 2016-07-19 MED ORDER — METOPROLOL SUCCINATE ER 50 MG PO TB24
100.0000 mg | ORAL_TABLET | Freq: Every day | ORAL | Status: DC
Start: 2016-07-19 — End: 2016-07-22
  Administered 2016-07-21 – 2016-07-22 (×2): 100 mg via ORAL
  Filled 2016-07-19 (×4): qty 2

## 2016-07-19 MED ORDER — VANCOMYCIN HCL IN DEXTROSE 1-5 GM/200ML-% IV SOLN
1000.0000 mg | Freq: Once | INTRAVENOUS | Status: AC
  Administered 2016-07-19: 1000 mg via INTRAVENOUS
  Filled 2016-07-19: qty 200

## 2016-07-19 MED ORDER — AMIODARONE HCL 200 MG PO TABS
400.0000 mg | ORAL_TABLET | Freq: Two times a day (BID) | ORAL | Status: DC
  Administered 2016-07-19: 400 mg via ORAL
  Filled 2016-07-19: qty 2

## 2016-07-19 MED ORDER — ONDANSETRON HCL 4 MG/2ML IJ SOLN: 4.0000 mg | Freq: Four times a day (QID) | INTRAMUSCULAR | Status: DC | PRN

## 2016-07-19 MED ORDER — LACTATED RINGERS IV SOLN
INTRAVENOUS | Status: DC
  Administered 2016-07-20: 01:00:00 via INTRAVENOUS

## 2016-07-19 MED ORDER — ACETAMINOPHEN 650 MG RE SUPP: 650.0000 mg | Freq: Four times a day (QID) | RECTAL | Status: DC | PRN

## 2016-07-19 MED ORDER — VANCOMYCIN HCL 10 G IV SOLR
1250.0000 mg | INTRAVENOUS | Status: DC
  Administered 2016-07-20 – 2016-07-22 (×4): 1250 mg via INTRAVENOUS
  Filled 2016-07-19 (×5): qty 1250

## 2016-07-19 MED ORDER — ACETAMINOPHEN 325 MG PO TABS
650.0000 mg | ORAL_TABLET | Freq: Four times a day (QID) | ORAL | Status: DC | PRN
  Administered 2016-07-22: 650 mg via ORAL
  Filled 2016-07-19: qty 2

## 2016-07-19 NOTE — Progress Notes (Signed)
Pt. Requested an Advance Directives. Chaplain provided an AD and education to the patient. Patient said that she will complete it after reading through it. A follow up for a Chaplain assigned at the unit or Chaplain On Call will be recommended.

## 2016-07-19 NOTE — H&P (Signed)
[facility] [place] [facility]   PATIENT NAME: Kathy Ashley    MR#:  [id number]  DATE OF BIRTH:  03/23/1946  DATE OF ADMISSION:  07/19/2016  PRIMARY CARE PHYSICIAN: Margaretann LovelessKHAN,NEELAM S, MD   REQUESTING/REFERRING PHYSICIAN: Dr. Yves DillNeelam Khan  CHIEF COMPLAINT:  No chief complaint on file.  Right Lower Ext. Redness, pain.   HISTORY OF PRESENT ILLNESS:  Kathy Ashley  is a 71 y.o. female with a known history of Atrial fibrillation, hypertension and depression who presents to the hospital due to right lower extremity redness, swelling and pain. Patient had a fall last Wednesday and develop some bruising on her right lower extremity. The breathing got significantly worse and she had some pain in the right lower extremity and went to see her primary care physician. Patient was noted to have some localized erythema and bruising, she was empirically placed on clindamycin for suspected cellulitis and sent home from her primary care physician's office. She returns back today as the pain in the right lower extremity is significantly worse with worsening redness and swelling. She was directly admitted from the primary care physician's office for cellulitis having failed oral antibiotics. Patient admits to some chills but no documented fever. She denies any chest pain, shortness of breath, nausea vomiting abdominal pain fever or any other associated symptoms.  PAST MEDICAL HISTORY:   Past Medical History:  Diagnosis Date  . Allergy   . Atrial fibrillation (HCC)   . Collapsed lung 2016  . Hepatitis A 2016  . Hypertension 2005  . Pneumonia 2016  . Seasonal allergies     PAST SURGICAL HISTORY:   Past Surgical History:  Procedure Laterality Date  . BREAST BIOPSY Right 2002   neg  . CARDIOVERSION    . CESAREAN SECTION    . COLONOSCOPY  [id number]  . DILATION AND CURETTAGE OF UTERUS    . ELECTROPHYSIOLOGIC STUDY N/A 02/09/2016   Procedure: CARDIOVERSION;  Surgeon: Laurier NancyShaukat A Khan, MD;   Location: [facility] ORS;  Service: Cardiovascular;  Laterality: N/A;  . TONSILLECTOMY      SOCIAL HISTORY:   Social History  Substance Use Topics  . Smoking status: Never Smoker  . Smokeless tobacco: Never Used  . Alcohol use Yes     Comment: wine occasionally    FAMILY HISTORY:   Family History  Problem Relation Age of Onset  . Diabetes Father   . Heart disease Mother   . Breast cancer Neg Hx     DRUG ALLERGIES:   Allergies  Allergen Reactions  . Penicillins Rash    REVIEW OF SYSTEMS:   Review of Systems  Constitutional: Negative for fever and weight loss.  HENT: Negative for congestion, nosebleeds and tinnitus.   Eyes: Negative for blurred vision, double vision and redness.  Respiratory: Negative for cough, hemoptysis and shortness of breath.   Cardiovascular: Positive for leg swelling (Right lower ext. swelling, redness). Negative for chest pain, orthopnea and PND.  Gastrointestinal: Negative for abdominal pain, diarrhea, melena, nausea and vomiting.  Genitourinary: Negative for dysuria, hematuria and urgency.  Musculoskeletal: Positive for falls and joint pain (Right Lower Ext. ).  Neurological: Negative for dizziness, tingling, sensory change, focal weakness, seizures, weakness and headaches.  Endo/Heme/Allergies: Negative for polydipsia. Does not bruise/bleed easily.  Psychiatric/Behavioral: Negative for depression and memory loss. The patient is not nervous/anxious.     MEDICATIONS AT HOME:   Prior to Admission medications   Medication Sig Start Date End Date Taking? Authorizing Provider  albuterol (  PROVENTIL HFA;VENTOLIN HFA) 108 (90 BASE) MCG/ACT inhaler Inhale 2 puffs into the lungs 2 (two) times daily.    Historical Provider, MD  albuterol (PROVENTIL) (2.5 MG/3ML) 0.083% nebulizer solution Take 2.5 mg by nebulization every 6 (six) hours as needed for wheezing or shortness of breath.    Historical Provider, MD  amiodarone (PACERONE) 400 MG tablet Take 400 mg  by mouth 2 (two) times daily. 02/07/16   Historical Provider, MD  apixaban (ELIQUIS) 5 MG TABS tablet Take 5 mg by mouth 2 (two) times daily.    Historical Provider, MD  furosemide (LASIX) 20 MG tablet Take 20 mg by mouth daily.  05/11/13   Historical Provider, MD  gabapentin (NEURONTIN) 300 MG capsule 300 mg 2 (two) times daily.  08/10/15   Historical Provider, MD  losartan (COZAAR) 25 MG tablet Take 25 mg by mouth daily.  07/18/15   Historical Provider, MD  metoprolol succinate (TOPROL-XL) 50 MG 24 hr tablet Take 100 mg by mouth daily.  06/26/13   Historical Provider, MD  montelukast (SINGULAIR) 10 MG tablet Take 10 mg by mouth daily. 06/23/14   Historical Provider, MD  Omega-3 Fatty Acids (FISH OIL) 1200 MG CAPS Take 1 capsule by mouth daily.    Historical Provider, MD  traZODone (DESYREL) 100 MG tablet Take 100 mg by mouth at bedtime.  06/16/15   Historical Provider, MD  Venlafaxine HCl 75 MG TB24 Take 75 mg by mouth daily.  08/04/15   Historical Provider, MD      VITAL SIGNS:  Blood pressure (!) 124/57, pulse 84, temperature 97.8 F (36.6 C), temperature source Oral, SpO2 95 %.  PHYSICAL EXAMINATION:  Physical Exam  GENERAL:  71 y.o.-year-old patient lying in bed in no acute distress.  EYES: Pupils equal, round, reactive to light and accommodation. No scleral icterus. Extraocular muscles intact.  HEENT: Head atraumatic, normocephalic. Oropharynx and nasopharynx clear. No oropharyngeal erythema, moist oral mucosa  NECK:  Supple, no jugular venous distention. No thyroid enlargement, no tenderness.  LUNGS: Normal breath sounds bilaterally, no wheezing, rales, rhonchi. No use of accessory muscles of respiration.  CARDIOVASCULAR: S1, S2 RRR. No murmurs, rubs, gallops, clicks.  ABDOMEN: Soft, nontender, nondistended. Bowel sounds present. No organomegaly or mass.  EXTREMITIES: No pedal edema, cyanosis, or clubbing. + 2 pedal & radial pulses b/l.  Right lower ext. Bruising noted and small area of  localized hematoma with some induration around it.  Somewhat warm to touch.  NEUROLOGIC: Cranial nerves II through XII are intact. No focal Motor or sensory deficits appreciated b/l PSYCHIATRIC: The patient is alert and oriented x 3. Good affect.  SKIN: No obvious rash, lesion, or ulcer.   LABORATORY PANEL:   CBC No results for input(s): WBC, HGB, HCT, PLT in the last 168 hours. ------------------------------------------------------------------------------------------------------------------  Chemistries  No results for input(s): NA, K, CL, CO2, GLUCOSE, BUN, CREATININE, CALCIUM, MG, AST, ALT, ALKPHOS, BILITOT in the last 168 hours.  Invalid input(s): GFRCGP ------------------------------------------------------------------------------------------------------------------  Cardiac Enzymes No results for input(s): TROPONINI in the last 168 hours. ------------------------------------------------------------------------------------------------------------------  RADIOLOGY:  No results found.   IMPRESSION AND PLAN:   71 year old female with past medical history of chronic atrial fibrillation, hypertension, depression, presented to the hospital after he traumatic fall a week ago noted to have significant right lower extremity redness swelling and pain.  1. Right lower extremity redness swelling and pain-etiology unclear presently. Suspected to be an underlying hematoma versus cellulitis. -I will empirically start the patient on treatment with cellulitis. I will  start the patient on IV vancomycin. -I will get a CT scan of her right lower extremity to rule out underlying hematoma or possible abscess formation that needs surgical intervention. Would consider surgical consult based on CT scan results. -Follow clinically, also consider a wound team consult.  2. History of chronic atrial fibrillation-continue amiodarone, Toprol. -Hold Eliquis is given concerns for Hematoma.   3. Essential  hypertension-continue Toprol, losartan.  4. Neuropathy - cont. Neurontin  5. Depression - cont. Effexor, Trazodone.     All the records are reviewed and case discussed with ED provider. Management plans discussed with the patient, family and they are in agreement.  CODE STATUS: Full Code  TOTAL TIME TAKING CARE OF THIS PATIENT: 45 minutes.    Houston Siren M.D on 07/19/2016 at 3:14 PM  Between 7am to 6pm - Pager - [phone]  After 6pm go to [email or web address] - password [email or web address] [facility]  [facility]  Office  [phone]  CC: Primary care physician; Margaretann Loveless, MD

## 2016-07-19 NOTE — Plan of Care (Signed)
Problem: Pain Managment: Goal: General experience of comfort will improve Outcome: Progressing Educated patient on asking for pain medication before the pain becomes too unbearable

## 2016-07-19 NOTE — Progress Notes (Signed)
Surgical consult requested- cellulitis vs infected hematoma. Might need drainage tomorrow (patient took eliquis today). Started on vancomycin per surgical team

## 2016-07-19 NOTE — Consult Note (Signed)
Date of Consultation:  07/19/2016  Requesting Physician:  Right lower extremity wound  Reason for Consultation:  Right lower extremity wound  History of Present Illness: Kathy Ashley is a 71 y.o. female admitted to the hospital today with right lower extremity wound. Patient reports that she fell a week ago and landed on her right side. She has a lot of bruises over the right shoulder and elbow as well as the right hip and right lower leg. Starting earlier this week, she noticed that the bruising over the right lower leg started becoming more sore and tender to touch with some red streaks along the skin. She was seen by her PCP and was recently started on clindamycin due to penicillin allergies and taking anticoagulation medication. However her wound was not getting any better and her PCP admitted her to the hospital for further evaluation and management. She denies having any fevers although has felt somewhat febrile. Denies any chest pain, shortness of breath, nausea or vomiting, abdominal pain, dysuria, hematuria, blood in the stools. She reports very mild occasional tingling over the right foot and feeling pressure sensation around the wound in the right lower leg. Denies any drainage from the wound area.   Of note, the patient has atrial fibrillation and takes Eliquis, which she took this morning.  Past Medical History: Past Medical History:  Diagnosis Date  . Allergy   . Atrial fibrillation (HCC)   . Collapsed lung 2016  . Hepatitis A 2016  . Hypertension 2005  . Pneumonia 2016  . Seasonal allergies      Past Surgical History: Past Surgical History:  Procedure Laterality Date  . BREAST BIOPSY Right 2002   neg  . CARDIOVERSION    . CESAREAN SECTION    . COLONOSCOPY  [id number]  . DILATION AND CURETTAGE OF UTERUS    . ELECTROPHYSIOLOGIC STUDY N/A 02/09/2016   Procedure: CARDIOVERSION;  Surgeon: Laurier NancyShaukat A Khan, MD;  Location: [facility] ORS;  Service: Cardiovascular;   Laterality: N/A;  . TONSILLECTOMY      Home Medications: Prior to Admission medications   Medication Sig Start Date End Date Taking? Authorizing Provider  amiodarone (PACERONE) 200 MG tablet Take by mouth daily.  02/07/16  Yes Historical Provider, MD  apixaban (ELIQUIS) 5 MG TABS tablet Take 5 mg by mouth 2 (two) times daily.   Yes Historical Provider, MD  clindamycin (CLEOCIN) 150 MG capsule Take 150 mg by mouth 3 (three) times daily.   Yes Historical Provider, MD  fluticasone furoate-vilanterol (BREO ELLIPTA) 100-25 MCG/INH AEPB Inhale into the lungs.   Yes Historical Provider, MD  furosemide (LASIX) 20 MG tablet Take 20 mg by mouth 2 (two) times daily.  05/11/13  Yes Historical Provider, MD  gabapentin (NEURONTIN) 300 MG capsule 300 mg at bedtime.  08/10/15  Yes Historical Provider, MD  losartan (COZAAR) 25 MG tablet Take 25 mg by mouth daily.  07/18/15  Yes Historical Provider, MD  meloxicam (MOBIC) 15 MG tablet Take 15 mg by mouth daily as needed for pain.   Yes Historical Provider, MD  montelukast (SINGULAIR) 10 MG tablet Take 10 mg by mouth daily. 06/23/14  Yes Historical Provider, MD  Omega-3 Fatty Acids (FISH OIL) 1200 MG CAPS Take 1 capsule by mouth 2 (two) times daily.    Yes Historical Provider, MD  pravastatin (PRAVACHOL) 20 MG tablet Take 20 mg by mouth daily.   Yes Historical Provider, MD  traZODone (DESYREL) 100 MG tablet Take 100 mg by mouth at bedtime.  06/16/15  Yes Historical Provider, MD  venlafaxine XR (EFFEXOR-XR) 75 MG 24 hr capsule Take 75 mg by mouth daily with breakfast.   Yes Historical Provider, MD    Allergies: Allergies  Allergen Reactions  . Penicillins Rash    Social History:  reports that she has never smoked. She has never used smokeless tobacco. She reports that she drinks alcohol. She reports that she does not use drugs.   Family History: Family History  Problem Relation Age of Onset  . Diabetes Father   . Heart disease Mother   . Breast cancer Neg Hx      Review of Systems: Review of Systems  Constitutional: Positive for fever. Negative for chills.  HENT: Negative for hearing loss.   Eyes: Negative for blurred vision.  Respiratory: Negative for cough and shortness of breath.   Cardiovascular: Negative for chest pain and leg swelling.  Gastrointestinal: Negative for abdominal pain, heartburn, nausea and vomiting.  Genitourinary: Negative for dysuria.  Musculoskeletal: Negative for myalgias.  Skin: Negative for rash.  Neurological: Positive for tingling (and pressure over RLE). Negative for dizziness.  Psychiatric/Behavioral: Negative for depression.    Physical Exam BP (!) 124/57 (BP Location: Left Arm)   Pulse 84   Temp 97.8 F (36.6 C) (Oral)   SpO2 95%  CONSTITUTIONAL: No acute distress HEENT:  Normocephalic, atraumatic, extraocular motion intact. NECK: Trachea is midline, and there is no jugular venous distension. RESPIRATORY:  Lungs are clear, and breath sounds are equal bilaterally. Normal respiratory effort without pathologic use of accessory muscles. CARDIOVASCULAR: Heart is regular without murmurs, gallops, or rubs. GI: The abdomen is soft, nondistended, nontender.  MUSCULOSKELETAL:  Normal muscle strength and tone in all four extremities.  No peripheral edema or cyanosis.  Palpable right PT/DP pulses. SKIN:  The patient has multiple areas of ecchymosis over the right shoulder, right elbow, right hip, and right lower leg.  The right lower leg has an area approximately 5 cm size with blistering and mild fluctuance, with surrounding cellulitis.  This is tender to palpation. NEUROLOGIC:  Motor and sensation is grossly normal over right lower extremity.  Cranial nerves are grossly intact. PSYCH:  Alert and oriented to person, place and time. Affect is normal.  Laboratory Analysis: Results for orders placed or performed during the hospital encounter of 07/19/16 (from the past 24 hour(s))  CBC     Status: Abnormal    Collection Time: 07/19/16  5:25 PM  Result Value Ref Range   WBC 7.5 3.6 - 11.0 K/uL   RBC 3.83 3.80 - 5.20 MIL/uL   Hemoglobin 11.6 (L) 12.0 - 16.0 g/dL   HCT 16.1 (L) 09.6 - 04.5 %   MCV 89.9 80.0 - 100.0 fL   MCH 30.4 26.0 - 34.0 pg   MCHC 33.8 32.0 - 36.0 g/dL   RDW 40.9 81.1 - 91.4 %   Platelets 224 150 - 440 K/uL  Basic metabolic panel     Status: Abnormal   Collection Time: 07/19/16  5:25 PM  Result Value Ref Range   Sodium 136 135 - 145 mmol/L   Potassium 4.1 3.5 - 5.1 mmol/L   Chloride 98 (L) 101 - 111 mmol/L   CO2 30 22 - 32 mmol/L   Glucose, Bld 97 65 - 99 mg/dL   BUN 28 (H) 6 - 20 mg/dL   Creatinine, Ser 7.82 (H) 0.44 - 1.00 mg/dL   Calcium 8.4 (L) 8.9 - 10.3 mg/dL   GFR calc non Af Amer 52 (  L) >60 mL/min   GFR calc Af Amer >60 >60 mL/min   Anion gap 8 5 - 15    Imaging: No results found.  Assessment and Plan: This is a 71 y.o. female who presents with What appears to be an infected hematoma over the right lower leg.  Discussed with the patient that given the Ellik was that she took this morning which is the higher risk of bleeding with any procedures or interventions. At this point we'll try her on IV antibiotics namely vancomycin and see how or cellulitis and infection improves. If there is no improvement by tomorrow then we may need to take her to the operating room for incision and drainage of that infected hematoma. Discussed with the patient that we would rather do this in the operating room to the week and used cautery for any bleeding control instead of doing this at bedside. The patient agrees with this plan. Vancomycin will be started. She will be nothing by mouth after midnight in case any procedure is needed tomorrow. Please continue holding the Eliquis.  Face-to-face time spent with the patient and care providers was 80 minutes, with more than 50% of the time spent counseling, educating, and coordinating care of the patient.     Howie Ill,  MD [facility]

## 2016-07-19 NOTE — Progress Notes (Signed)
Pharmacy Antibiotic Note  Kathy Ashley is a 71 y.o. female admitted on 07/19/2016 with  cellulitis vs infected hematoma.  Pharmacy has been consulted for vancomycin dosing.  Plan: Ke: 0.053   t1/2: 13   Vd: 60  Will give vancomycin 1gm followed by vancomycin 1250mg  IV every 18 hours with 6 hour stack dosing. Calculated trough at Css is 15. Trough ordered prior to 4th dose. Will monitor renal function daily and adjust dose as needed.      Height: 5' 9.6" (176.8 cm) (5.8) Weight: 190 lb 4.8 oz (86.3 kg) IBW/kg (Calculated) : 67.58  Temp (24hrs), Avg:97.8 F (36.6 C), Min:97.8 F (36.6 C), Max:97.8 F (36.6 C)   Recent Labs Lab 07/19/16 1725  WBC 7.5  CREATININE 1.06*    Estimated Creatinine Clearance: 58.5 mL/min (A) (by C-G formula based on SCr of 1.06 mg/dL (H)).    Allergies  Allergen Reactions  . Penicillins Rash    Antimicrobials this admission: 3/15 vancomycin  >>  Dose adjustments this admission:  Microbiology results:  Thank you for allowing pharmacy to be a part of this patient's care.  Gardner CandleSheema M Nycholas Ashley, PharmD, BCPS Clinical Pharmacist 07/19/2016 6:35 PM

## 2016-07-20 LAB — CBC
HCT: 32.5 % — ABNORMAL LOW (ref 35.0–47.0)
Hemoglobin: 11.1 g/dL — ABNORMAL LOW (ref 12.0–16.0)
MCH: 30.5 pg (ref 26.0–34.0)
MCHC: 34 g/dL (ref 32.0–36.0)
MCV: 89.7 fL (ref 80.0–100.0)
PLATELETS: 217 10*3/uL (ref 150–440)
RBC: 3.63 MIL/uL — AB (ref 3.80–5.20)
RDW: 14.2 % (ref 11.5–14.5)
WBC: 6 10*3/uL (ref 3.6–11.0)

## 2016-07-20 LAB — BASIC METABOLIC PANEL
ANION GAP: 4 — AB (ref 5–15)
BUN: 22 mg/dL — ABNORMAL HIGH (ref 6–20)
CHLORIDE: 106 mmol/L (ref 101–111)
CO2: 30 mmol/L (ref 22–32)
Calcium: 8.1 mg/dL — ABNORMAL LOW (ref 8.9–10.3)
Creatinine, Ser: 0.84 mg/dL (ref 0.44–1.00)
GFR calc non Af Amer: >60 mL/min (ref >60)
Glucose, Bld: 98 mg/dL (ref 65–99)
Potassium: 4.1 mmol/L (ref 3.5–5.1)
Sodium: 140 mmol/L (ref 135–145)

## 2016-07-20 MED ORDER — AMIODARONE HCL 200 MG PO TABS
400.0000 mg | ORAL_TABLET | Freq: Every day | ORAL | Status: DC
  Administered 2016-07-20 – 2016-07-22 (×2): 400 mg via ORAL
  Filled 2016-07-20 (×2): qty 2

## 2016-07-20 MED ORDER — ENOXAPARIN SODIUM 40 MG/0.4ML ~~LOC~~ SOLN: 40.0000 mg | SUBCUTANEOUS | Status: DC

## 2016-07-20 MED ORDER — OXYCODONE HCL 5 MG PO TABS
5.0000 mg | ORAL_TABLET | ORAL | Status: DC | PRN
  Administered 2016-07-20: 10 mg via ORAL
  Administered 2016-07-20: 5 mg via ORAL
  Administered 2016-07-21 – 2016-07-22 (×5): 10 mg via ORAL
  Filled 2016-07-20 (×3): qty 2
  Filled 2016-07-20: qty 1
  Filled 2016-07-20 (×3): qty 2

## 2016-07-20 MED ORDER — IBUPROFEN 400 MG PO TABS
400.0000 mg | ORAL_TABLET | Freq: Four times a day (QID) | ORAL | Status: DC | PRN
  Administered 2016-07-20: 400 mg via ORAL
  Filled 2016-07-20: qty 1

## 2016-07-20 MED ORDER — LOSARTAN POTASSIUM 25 MG PO TABS
25.0000 mg | ORAL_TABLET | Freq: Every day | ORAL | Status: DC
  Administered 2016-07-20: 25 mg via ORAL
  Filled 2016-07-20: qty 1

## 2016-07-20 MED ORDER — ENOXAPARIN SODIUM 40 MG/0.4ML ~~LOC~~ SOLN
40.0000 mg | SUBCUTANEOUS | Status: DC
  Filled 2016-07-20: qty 0.4

## 2016-07-20 NOTE — Progress Notes (Signed)
07/20/2016  Subjective: No acute events overnight. Patient was started on IV vancomycin for a possibly infected hematoma of the right lower leg. Patient reports that today the amount of pressure and tenderness has improved mildly.  Vital signs: Temp:  [97.6 F (36.4 C)-97.8 F (36.6 C)] 97.6 F (36.4 C) (03/16 1205) Pulse Rate:  [67-84] 67 (03/16 1205) Resp:  [16-18] 16 (03/16 1205) BP: (115-140)/(54-75) 130/75 (03/16 1205) SpO2:  [95 %-100 %] 100 % (03/16 1205) Weight:  [86.3 kg (190 lb 4.8 oz)] 86.3 kg (190 lb 4.8 oz) (03/15 1800)   Intake/Output: 03/15 0701 - 03/16 0700 In: 755.9 [P.O.:240; I.V.:260.4; IV Piggyback:255.4] Out: 1700 [Urine:1700]    Physical Exam: Constitutional: No acute distress Extremities: Right lower extremity with improved cellulitis around the right lower leg. There is less fluctuance noted to the middle of the wound compared to yesterday. Currently no active drainage. Palpable pulses PT and DP  Labs:   Recent Labs  07/19/16 1725 07/20/16 0451  WBC 7.5 6.0  HGB 11.6* 11.1*  HCT 34.5* 32.5*  PLT 224 217    Recent Labs  07/19/16 1725 07/20/16 0451  NA 136 140  K 4.1 4.1  CL 98* 106  CO2 30 30  GLUCOSE 97 98  BUN 28* 22*  CREATININE 1.06* 0.84  CALCIUM 8.4* 8.1*   No results for input(s): LABPROT, INR in the last 72 hours.  Imaging: Ct Tibia Fibula Right Wo Contrast  Result Date: 07/20/2016 CLINICAL DATA:  Mid lower extremity redness, swelling, and pain since a fall last Wednesday. Bruising. EXAM: CT OF THE RIGHT LOWER LEG WITHOUT CONTRAST TECHNIQUE: Multidetector CT imaging of the right lower leg was performed according to the standard protocol. COMPARISON:  Radiographs dated 03/31/2015 FINDINGS: Bones/Joint/Cartilage There is no fracture or dislocation or joint effusion. Ligaments Suboptimally assessed by CT.  Intact. Muscles and Tendons Normal. Soft tissues Poorly defined area of subcutaneous increased density consistent with soft tissue  contusion and the hematoma. There several phleboliths in the soft tissues of the lower leg at the level of the probable soft tissue contusion. I cannot exclude infection but there is no definable abscess. IMPRESSION: Focal area of abnormal soft tissue density in the subcutaneous fat at the lateral aspect of the distal right lower leg. This most likely represents soft tissue contusion. No definable abscess. Cellulitis cannot be excluded radiographically. Electronically Signed   By: Francene BoyersJames  Maxwell M.D.   On: 07/20/2016 09:08    Assessment/Plan: 71 year old female with a possibly infected hematoma of the right lower leg.  -Continue IV vancomycin as a cellulitis is currently improving. -Continue holding Eliquis. -If There is no further improvement by tomorrow the patient will be taken to the operating room for I&D of this wound. She will be nothing by mouth after midnight just in case she does need a procedure in the morning.   Howie IllJose Luis Abdon Petrosky, MD [facility]

## 2016-07-20 NOTE — Progress Notes (Signed)
Pt. Admitted for cellulitis but not any DVT prophylaxis. Spoke with overnight MD and recommended to initiate lovenox 40 mg daily.  Will initiate Lovenox 40 mg daily for DVT prophylaxis.  Thomasene Rippleavid Dyllen Menning, PharmD, BCPS Clinical Pharmacist 07/20/2016

## 2016-07-20 NOTE — Progress Notes (Signed)
[employer] - [place] at [facility]   PATIENT NAME: Kathy Ashley    MR#:  [id number]  DATE OF BIRTH:  [birth year]  SUBJECTIVE:  CHIEF COMPLAINT:  No chief complaint on file.  Right leg swelling. No improvement in pain. No discharge. Afebrile.  REVIEW OF SYSTEMS:    Review of Systems  Constitutional: Negative for chills and fever.  HENT: Negative for sore throat.   Eyes: Negative for blurred vision, double vision and pain.  Respiratory: Negative for cough, hemoptysis, shortness of breath and wheezing.   Cardiovascular: Negative for chest pain, palpitations, orthopnea and leg swelling.  Gastrointestinal: Negative for abdominal pain, constipation, diarrhea, heartburn, nausea and vomiting.  Genitourinary: Negative for dysuria and hematuria.  Musculoskeletal: Negative for back pain and joint pain.  Skin: Negative for rash.  Neurological: Negative for sensory change, speech change, focal weakness and headaches.  Endo/Heme/Allergies: Does not bruise/bleed easily.  Psychiatric/Behavioral: Negative for depression. The patient is not nervous/anxious.     DRUG ALLERGIES:   Allergies  Allergen Reactions  . Penicillins Rash    VITALS:  Blood pressure 130/75, pulse 67, temperature 97.6 F (36.4 C), temperature source Oral, resp. rate 16, height 5' 9.6" (1.768 m), weight 86.3 kg (190 lb 4.8 oz), SpO2 100 %.  PHYSICAL EXAMINATION:   Physical Exam  GENERAL:  71 y.o.-year-old patient lying in the bed with no acute distress.  EYES: Pupils equal, round, reactive to light and accommodation. No scleral icterus. Extraocular muscles intact.  HEENT: Head atraumatic, normocephalic. Oropharynx and nasopharynx clear.  NECK:  Supple, no jugular venous distention. No thyroid enlargement, no tenderness.  LUNGS: Normal breath sounds bilaterally, no wheezing, rales, rhonchi. No use of accessory muscles of respiration.  CARDIOVASCULAR: S1, S2 normal. No murmurs, rubs, or gallops.   ABDOMEN: Soft, nontender, nondistended. Bowel sounds present. No organomegaly or mass.  EXTREMITIES: No cyanosis, clubbing or edema b/l.    NEUROLOGIC: Cranial nerves II through XII are intact. No focal Motor or sensory deficits b/l.   PSYCHIATRIC: The patient is alert and oriented x 3.  SKIN: Right lower leg lateral swelling, warmth and tender with bruising over it 10x5 cm  LABORATORY PANEL:   CBC  Recent Labs Lab 07/20/16 0451  WBC 6.0  HGB 11.1*  HCT 32.5*  PLT 217   ------------------------------------------------------------------------------------------------------------------ Chemistries   Recent Labs Lab 07/20/16 0451  NA 140  K 4.1  CL 106  CO2 30  GLUCOSE 98  BUN 22*  CREATININE 0.84  CALCIUM 8.1*   ------------------------------------------------------------------------------------------------------------------  Cardiac Enzymes No results for input(s): TROPONINI in the last 168 hours. ------------------------------------------------------------------------------------------------------------------  RADIOLOGY:  Ct Tibia Fibula Right Wo Contrast  Result Date: 07/20/2016 CLINICAL DATA:  Mid lower extremity redness, swelling, and pain since a fall last Wednesday. Bruising. EXAM: CT OF THE RIGHT LOWER LEG WITHOUT CONTRAST TECHNIQUE: Multidetector CT imaging of the right lower leg was performed according to the standard protocol. COMPARISON:  Radiographs dated 03/31/2015 FINDINGS: Bones/Joint/Cartilage There is no fracture or dislocation or joint effusion. Ligaments Suboptimally assessed by CT.  Intact. Muscles and Tendons Normal. Soft tissues Poorly defined area of subcutaneous increased density consistent with soft tissue contusion and the hematoma. There several phleboliths in the soft tissues of the lower leg at the level of the probable soft tissue contusion. I cannot exclude infection but there is no definable abscess. IMPRESSION: Focal area of abnormal soft  tissue density in the subcutaneous fat at the lateral aspect of the distal right lower leg. This most likely  represents soft tissue contusion. No definable abscess. Cellulitis cannot be excluded radiographically. Electronically Signed   By: Francene BoyersJames  Maxwell M.D.   On: 07/20/2016 09:08     ASSESSMENT AND PLAN:   71 year old female with past medical history of chronic atrial fibrillation, hypertension, depression, presented to the hospital after he traumatic fall a week ago noted to have significant right lower extremity redness swelling and pain.  1. Right lower extremity redness swelling and pain Likely an infected hematoma with overlying cellulitis  On IV abx Appreciate Surgery input. May need incision and drainage if no improvement.  2. History of chronic atrial fibrillation-continue amiodarone, Toprol. -Hold Eliquis  given concerns for Hematoma and need for I&D possible.  3. Essential hypertension-continue Toprol, losartan.  4. Neuropathy - cont. Neurontin  5. Depression - cont. Effexor, Trazodone.   All the records are reviewed and case discussed with Care Management/Social Workerr. Management plans discussed with the patient, family and they are in agreement.  CODE STATUS: FULL CODE  DVT Prophylaxis: SCDs  TOTAL TIME TAKING CARE OF THIS PATIENT: 30 minutes.   POSSIBLE D/C IN 1-2 DAYS, DEPENDING ON CLINICAL CONDITION.  Milagros LollSudini, Arella Blinder R M.D on 07/20/2016 at 1:38 PM  Between 7am to 6pm - Pager - [phone]  After 6pm go to [email or web address] - password [email or web address] [facility]  [facility] [place] [facility]  Office  [phone]  CC: Primary care physician; Margaretann LovelessKHAN,NEELAM S, MD  Note: This dictation was prepared with Dragon dictation along with smaller phrase technology. Any transcriptional errors that result from this process are unintentional.

## 2016-07-21 ENCOUNTER — Inpatient Hospital Stay: Payer: Medicare Other | Admitting: Certified Registered Nurse Anesthetist

## 2016-07-21 ENCOUNTER — Encounter: Payer: Self-pay | Admitting: Certified Registered Nurse Anesthetist

## 2016-07-21 ENCOUNTER — Encounter
Admission: AD | Disposition: A | Discharge status: Home / Self Care | Payer: Self-pay | Source: Ambulatory Visit | Attending: Internal Medicine

## 2016-07-21 HISTORY — PX: INCISION AND DRAINAGE ABSCESS: SHX5864

## 2016-07-21 LAB — MRSA PCR SCREENING: MRSA BY PCR: NEGATIVE

## 2016-07-21 SURGERY — INCISION AND DRAINAGE, ABSCESS
Anesthesia: General | Laterality: Right

## 2016-07-21 MED ORDER — ONDANSETRON HCL 4 MG/2ML IJ SOLN: 4.0000 mg | Freq: Once | INTRAMUSCULAR | Status: DC | PRN

## 2016-07-21 MED ORDER — PHENYLEPHRINE HCL 10 MG/ML IJ SOLN
INTRAMUSCULAR | Status: DC | PRN
  Administered 2016-07-21: 100 ug via INTRAVENOUS
  Administered 2016-07-21 (×3): 200 ug via INTRAVENOUS

## 2016-07-21 MED ORDER — ONDANSETRON HCL 4 MG/2ML IJ SOLN
INTRAMUSCULAR | Status: AC
  Filled 2016-07-21: qty 2

## 2016-07-21 MED ORDER — SUCCINYLCHOLINE CHLORIDE 20 MG/ML IJ SOLN
INTRAMUSCULAR | Status: AC
  Filled 2016-07-21: qty 1

## 2016-07-21 MED ORDER — POLYETHYLENE GLYCOL 3350 17 G PO PACK: 17.0000 g | PACK | Freq: Every day | ORAL | Status: DC | PRN

## 2016-07-21 MED ORDER — ONDANSETRON HCL 4 MG/2ML IJ SOLN
INTRAMUSCULAR | Status: DC | PRN
  Administered 2016-07-21: 4 mg via INTRAVENOUS

## 2016-07-21 MED ORDER — LIDOCAINE HCL (PF) 2 % IJ SOLN
INTRAMUSCULAR | Status: AC
  Filled 2016-07-21: qty 2

## 2016-07-21 MED ORDER — BUPIVACAINE HCL (PF) 0.5 % IJ SOLN
INTRAMUSCULAR | Status: AC
  Filled 2016-07-21: qty 10

## 2016-07-21 MED ORDER — DEXAMETHASONE SODIUM PHOSPHATE 10 MG/ML IJ SOLN
INTRAMUSCULAR | Status: DC | PRN
  Administered 2016-07-21: 10 mg via INTRAVENOUS

## 2016-07-21 MED ORDER — BUPIVACAINE HCL (PF) 0.5 % IJ SOLN
INTRAMUSCULAR | Status: DC | PRN
  Administered 2016-07-21: 10 mL

## 2016-07-21 MED ORDER — PROPOFOL 10 MG/ML IV BOLUS
INTRAVENOUS | Status: DC | PRN
  Administered 2016-07-21: 120 mg via INTRAVENOUS

## 2016-07-21 MED ORDER — DOCUSATE SODIUM 100 MG PO CAPS
200.0000 mg | ORAL_CAPSULE | Freq: Two times a day (BID) | ORAL | Status: DC
  Administered 2016-07-21 – 2016-07-22 (×2): 200 mg via ORAL
  Filled 2016-07-21 (×2): qty 2

## 2016-07-21 MED ORDER — FENTANYL CITRATE (PF) 100 MCG/2ML IJ SOLN
INTRAMUSCULAR | Status: AC
Start: 2016-07-21 — End: 2016-07-21
  Administered 2016-07-21: 25 ug via INTRAVENOUS
  Filled 2016-07-21: qty 2

## 2016-07-21 MED ORDER — LACTATED RINGERS IV SOLN
INTRAVENOUS | Status: DC | PRN
  Administered 2016-07-21: 10:00:00 via INTRAVENOUS

## 2016-07-21 MED ORDER — MIDAZOLAM HCL 2 MG/2ML IJ SOLN
INTRAMUSCULAR | Status: DC | PRN
  Administered 2016-07-21: 2 mg via INTRAVENOUS

## 2016-07-21 MED ORDER — MIDAZOLAM HCL 2 MG/2ML IJ SOLN
INTRAMUSCULAR | Status: AC
  Filled 2016-07-21: qty 2

## 2016-07-21 MED ORDER — FENTANYL CITRATE (PF) 100 MCG/2ML IJ SOLN
25.0000 ug | INTRAMUSCULAR | Status: DC | PRN
  Administered 2016-07-21 (×2): 25 ug via INTRAVENOUS

## 2016-07-21 MED ORDER — ACETAMINOPHEN 10 MG/ML IV SOLN
INTRAVENOUS | Status: AC
  Filled 2016-07-21: qty 100

## 2016-07-21 MED ORDER — DEXAMETHASONE SODIUM PHOSPHATE 10 MG/ML IJ SOLN
INTRAMUSCULAR | Status: AC
  Filled 2016-07-21: qty 1

## 2016-07-21 MED ORDER — FENTANYL CITRATE (PF) 100 MCG/2ML IJ SOLN
INTRAMUSCULAR | Status: AC
  Filled 2016-07-21: qty 2

## 2016-07-21 MED ORDER — LIDOCAINE HCL (CARDIAC) 20 MG/ML IV SOLN
INTRAVENOUS | Status: DC | PRN
  Administered 2016-07-21: 100 mg via INTRAVENOUS

## 2016-07-21 MED ORDER — FENTANYL CITRATE (PF) 100 MCG/2ML IJ SOLN
INTRAMUSCULAR | Status: DC | PRN
  Administered 2016-07-21: 50 ug via INTRAVENOUS

## 2016-07-21 MED ORDER — ACETAMINOPHEN 10 MG/ML IV SOLN
INTRAVENOUS | Status: DC | PRN
  Administered 2016-07-21: 1000 mg via INTRAVENOUS

## 2016-07-21 MED ORDER — PROPOFOL 10 MG/ML IV BOLUS
INTRAVENOUS | Status: AC
  Filled 2016-07-21: qty 20

## 2016-07-21 SURGICAL SUPPLY — 20 items
BNDG GAUZE 4.5X4.1 6PLY STRL (MISCELLANEOUS) IMPLANT
CHLORAPREP W/TINT 26ML (MISCELLANEOUS) ×3 IMPLANT
DRAIN PENROSE 1/4X12 LTX (DRAIN) IMPLANT
DRAPE LAPAROTOMY 100X77 ABD (DRAPES) ×3 IMPLANT
ELECT REM PT RETURN 9FT ADLT (ELECTROSURGICAL) ×3
ELECTRODE REM PT RTRN 9FT ADLT (ELECTROSURGICAL) ×1 IMPLANT
GAUZE IODOFORM PACK 1/2 7832 (GAUZE/BANDAGES/DRESSINGS) ×3 IMPLANT
GAUZE SPONGE 4X4 12PLY STRL (GAUZE/BANDAGES/DRESSINGS) ×3 IMPLANT
GLOVE SURG SYN 7.0 (GLOVE) ×3 IMPLANT
GLOVE SURG SYN 7.5  E (GLOVE) ×6
GLOVE SURG SYN 7.5 E (GLOVE) ×3 IMPLANT
GOWN STRL REUS W/ TWL LRG LVL3 (GOWN DISPOSABLE) ×2 IMPLANT
GOWN STRL REUS W/TWL LRG LVL3 (GOWN DISPOSABLE) ×4
KIT RM TURNOVER STRD PROC AR (KITS) ×3 IMPLANT
LABEL OR SOLS (LABEL) ×3 IMPLANT
NS IRRIG 500ML POUR BTL (IV SOLUTION) ×3 IMPLANT
PACK BASIN MINOR ARMC (MISCELLANEOUS) ×3 IMPLANT
PAD ABD DERMACEA PRESS 5X9 (GAUZE/BANDAGES/DRESSINGS) ×3 IMPLANT
SWAB CULTURE AMIES ANAERIB BLU (MISCELLANEOUS) ×3 IMPLANT
SYR BULB IRRIG 60ML STRL (SYRINGE) ×3 IMPLANT

## 2016-07-21 NOTE — Op Note (Signed)
  Procedure Date:  07/21/2016  Pre-operative Diagnosis:  Right lower leg abscess  Post-operative Diagnosis:  Right lower leg abscess  Procedure:  Incision and Drainage of right lower leg abscess  Surgeon:  Howie IllJose Luis Tayana Shankle, MD  Anesthesia:  General endotracheal  Estimated Blood Loss:  5 ml  Specimens:  Culture swab  Complications:  None  Indications for Procedure:  This is a 71 y.o. female with diagnosis of right lower leg abscess, requiring drainage procedure.  The risks of bleeding, abscess or infection, injury to surrounding structures, and need for further procedures were all discussed with the patient and was willing to proceed.  Description of Procedure: The patient was correctly identified in the preoperative area and brought into the operating room.  The patient was placed supine with VTE prophylaxis in place.  Appropriate time-outs were performed.  Anesthesia was induced and the patient was intubated.  Appropriate antibiotics were infused.  The patient's right lower leg was prepped and draped in usual sterile fashion.  A 2 cm cruciate incision was made over the abscess, revealing purulent fluid mixed with old hematoma.  This fluid was swabbed for culture and sent to micro.  Small Kelly forceps were used to dissect around the abscess tissue to open any remaining pockets of purulent fluid.  After drainage was completed, the cavity was irrigated and cleaned.  The wound was packed with 1/2 inch iodoform gauze and covered with dry gauze and tape.  The patient tolerated the procedure well and all counts were correct at the end of the case.   Howie IllJose Luis Benjiman Sedgwick, MD

## 2016-07-21 NOTE — Transfer of Care (Signed)
Immediate Anesthesia Transfer of Care Note  Patient: Kathy Ashley  Procedure(s) Performed: Procedure(s): INCISION AND DRAINAGE ABSCESS Right Lower Leg (Right)  Patient Location: PACU  Anesthesia Type:General  Level of Consciousness: awake and alert   Airway & Oxygen Therapy: Patient Spontanous Breathing and Patient connected to face mask oxygen  Post-op Assessment: Report given to RN and Post -op Vital signs reviewed and stable  Post vital signs: Reviewed and stable  Last Vitals:  Vitals:   07/21/16 0803 07/21/16 1058  BP: (!) 114/57 (!) 101/51  Pulse: 69 (!) 59  Resp:  13  Temp:  36.8 C    Last Pain:  Vitals:   07/21/16 1058  TempSrc: Temporal  PainSc:       Patients Stated Pain Goal: 1 (07/20/16 2028)  Complications: No apparent anesthesia complications

## 2016-07-21 NOTE — Anesthesia Preprocedure Evaluation (Signed)
Anesthesia Evaluation  Patient identified by MRN, date of birth, ID band Patient awake    Reviewed: Allergy & Precautions, NPO status , Patient's Chart, lab work & pertinent test results  History of Anesthesia Complications Negative for: history of anesthetic complications  Airway Mallampati: I       Dental  (+) Teeth Intact, Caps   Pulmonary neg pulmonary ROS,           Cardiovascular Exercise Tolerance: Good hypertension, Pt. on home beta blockers (-) angina(-) CAD, (-) Past MI, (-) Cardiac Stents and (-) CABG + dysrhythmias Atrial Fibrillation (-) Valvular Problems/Murmurs     Neuro/Psych negative neurological ROS     GI/Hepatic negative GI ROS, (+) Hepatitis - (resolved), A  Endo/Other  negative endocrine ROS  Renal/GU negative Renal ROS     Musculoskeletal   Abdominal Normal abdominal exam  (+)   Peds  Hematology negative hematology ROS (+)   Anesthesia Other Findings Past Medical History: No date: Allergy No date: Atrial fibrillation (HCC) 2016: Collapsed lung 2016: Hepatitis A 2005: Hypertension 2016: Pneumonia No date: Seasonal allergies   Reproductive/Obstetrics negative OB ROS                             Anesthesia Physical  Anesthesia Plan  ASA: III  Anesthesia Plan: General   Post-op Pain Management:    Induction: Intravenous  Airway Management Planned: Natural Airway and Nasal Cannula  Additional Equipment:   Intra-op Plan:   Post-operative Plan:   Informed Consent: I have reviewed the patients History and Physical, chart, labs and discussed the procedure including the risks, benefits and alternatives for the proposed anesthesia with the patient or authorized representative who has indicated his/her understanding and acceptance.     Plan Discussed with: CRNA and Anesthesiologist  Anesthesia Plan Comments:         Anesthesia Quick Evaluation

## 2016-07-21 NOTE — Progress Notes (Signed)
[employer] - [place] at [facility]   PATIENT NAME: Kathy Ashley    MR#:  [id number]  DATE OF BIRTH:  11/15/1945  SUBJECTIVE:  CHIEF COMPLAINT:  No chief complaint on file.  Right leg swelling. No improvement in pain. Afebrile OR today  REVIEW OF SYSTEMS:    Review of Systems  Constitutional: Negative for chills and fever.  HENT: Negative for sore throat.   Eyes: Negative for blurred vision, double vision and pain.  Respiratory: Negative for cough, hemoptysis, shortness of breath and wheezing.   Cardiovascular: Negative for chest pain, palpitations, orthopnea and leg swelling.  Gastrointestinal: Negative for abdominal pain, constipation, diarrhea, heartburn, nausea and vomiting.  Genitourinary: Negative for dysuria and hematuria.  Musculoskeletal: Negative for back pain and joint pain.  Skin: Negative for rash.  Neurological: Negative for sensory change, speech change, focal weakness and headaches.  Endo/Heme/Allergies: Does not bruise/bleed easily.  Psychiatric/Behavioral: Negative for depression. The patient is not nervous/anxious.     DRUG ALLERGIES:   Allergies  Allergen Reactions  . Penicillins Rash    VITALS:  Blood pressure (!) 114/57, pulse 69, temperature 97.9 F (36.6 C), temperature source Oral, resp. rate 18, height 5' 9.6" (1.768 m), weight 86.3 kg (190 lb 4.8 oz), SpO2 96 %.  PHYSICAL EXAMINATION:   Physical Exam  GENERAL:  71 y.o.-year-old patient lying in the bed with no acute distress.  EYES: Pupils equal, round, reactive to light and accommodation. No scleral icterus. Extraocular muscles intact.  HEENT: Head atraumatic, normocephalic. Oropharynx and nasopharynx clear.  NECK:  Supple, no jugular venous distention. No thyroid enlargement, no tenderness.  LUNGS: Normal breath sounds bilaterally, no wheezing, rales, rhonchi. No use of accessory muscles of respiration.  CARDIOVASCULAR: S1, S2 normal. No murmurs, rubs, or gallops.   ABDOMEN: Soft, nontender, nondistended. Bowel sounds present. No organomegaly or mass.  EXTREMITIES: No cyanosis, clubbing or edema b/l.    NEUROLOGIC: Cranial nerves II through XII are intact. No focal Motor or sensory deficits b/l.   PSYCHIATRIC: The patient is alert and oriented x 3.  SKIN: Right lower leg lateral swelling, warmth and tender with bruising over it 10x5 cm  LABORATORY PANEL:   CBC  Recent Labs Lab 07/20/16 0451  WBC 6.0  HGB 11.1*  HCT 32.5*  PLT 217   ------------------------------------------------------------------------------------------------------------------ Chemistries   Recent Labs Lab 07/20/16 0451  NA 140  K 4.1  CL 106  CO2 30  GLUCOSE 98  BUN 22*  CREATININE 0.84  CALCIUM 8.1*   ------------------------------------------------------------------------------------------------------------------  Cardiac Enzymes No results for input(s): TROPONINI in the last 168 hours. ------------------------------------------------------------------------------------------------------------------  RADIOLOGY:  Ct Tibia Fibula Right Wo Contrast  Result Date: 07/20/2016 CLINICAL DATA:  Mid lower extremity redness, swelling, and pain since a fall last Wednesday. Bruising. EXAM: CT OF THE RIGHT LOWER LEG WITHOUT CONTRAST TECHNIQUE: Multidetector CT imaging of the right lower leg was performed according to the standard protocol. COMPARISON:  Radiographs dated 03/31/2015 FINDINGS: Bones/Joint/Cartilage There is no fracture or dislocation or joint effusion. Ligaments Suboptimally assessed by CT.  Intact. Muscles and Tendons Normal. Soft tissues Poorly defined area of subcutaneous increased density consistent with soft tissue contusion and the hematoma. There several phleboliths in the soft tissues of the lower leg at the level of the probable soft tissue contusion. I cannot exclude infection but there is no definable abscess. IMPRESSION: Focal area of abnormal soft  tissue density in the subcutaneous fat at the lateral aspect of the distal right lower leg. This most  likely represents soft tissue contusion. No definable abscess. Cellulitis cannot be excluded radiographically. Electronically Signed   By: Francene BoyersJames  Maxwell M.D.   On: 07/20/2016 09:08     ASSESSMENT AND PLAN:   71 year old female with past medical history of chronic atrial fibrillation, hypertension, depression, presented to the hospital after he traumatic fall a week ago noted to have significant right lower extremity redness swelling and pain.  1. Right lower extremity redness swelling and pain Likely an infected hematoma with overlying cellulitis  On IV abx Appreciate Surgery input. InD today in OR  Discussed with Dr. Aleen CampiPiscoya  2. History of chronic atrial fibrillation-continue amiodarone, Toprol. -Hold Eliquis  given concerns for Hematoma and need for I&D possible.  3. Essential hypertension-continue Toprol, losartan.  4. Neuropathy - cont. Neurontin  5. Depression - cont. Effexor, Trazodone.   All the records are reviewed and case discussed with Care Management/Social Worker Management plans discussed with the patient, family and they are in agreement.  CODE STATUS: FULL CODE  DVT Prophylaxis: SCDs  TOTAL TIME TAKING CARE OF THIS PATIENT: 30 minutes.   POSSIBLE D/C IN 1-2 DAYS, DEPENDING ON CLINICAL CONDITION.  Milagros LollSudini, Doreen Garretson R M.D on 07/21/2016 at 10:38 AM  Between 7am to 6pm - Pager - [phone]  After 6pm go to [email or web address] - password [email or web address] [facility]  [facility] [place] [facility]  Office  [phone]  CC: Primary care physician; Margaretann LovelessKHAN,NEELAM S, MD  Note: This dictation was prepared with Dragon dictation along with smaller phrase technology. Any transcriptional errors that result from this process are unintentional.

## 2016-07-21 NOTE — Anesthesia Post-op Follow-up Note (Cosign Needed)
Anesthesia QCDR form completed.        

## 2016-07-21 NOTE — Anesthesia Procedure Notes (Signed)
Procedure Name: LMA Insertion Date/Time: 07/21/2016 10:22 AM Performed by: Ginger CarneMICHELET, Kimila Papaleo Pre-anesthesia Checklist: Patient identified, Emergency Drugs available, Suction available, Patient being monitored and Timeout performed Oxygen Delivery Method: Circle system utilized Preoxygenation: Pre-oxygenation with 100% oxygen Intubation Type: IV induction LMA: LMA inserted LMA Size: 3.0 Number of attempts: 1 Placement Confirmation: positive ETCO2 and breath sounds checked- equal and bilateral Tube secured with: Tape Dental Injury: Teeth and Oropharynx as per pre-operative assessment

## 2016-07-21 NOTE — Progress Notes (Signed)
07/21/2016  Subjective: No acute events overnight.  Patient reports continued pain over the right lower leg wound.  No fevers or chills.  Vital signs: Temp:  [97.6 F (36.4 C)-97.9 F (36.6 C)] 97.9 F (36.6 C) (03/17 0458) Pulse Rate:  [67-76] 69 (03/17 0803) Resp:  [16-18] 18 (03/17 0458) BP: (96-136)/(47-75) 114/57 (03/17 0803) SpO2:  [94 %-100 %] 96 % (03/17 0803)   Intake/Output: 03/16 0701 - 03/17 0700 In: 240 [P.O.:240] Out: 3200 [Urine:3200] Last BM Date: 07/18/16  Physical Exam: Constitutional: No acute distress Extremity:  Right lower leg wound with decreased cellulitis but still tender and with area of fluctuance.  No active drainage.  Other areas of ecchymosis over the right leg without evidence of infection.  Labs:   Recent Labs  07/19/16 1725 07/20/16 0451  WBC 7.5 6.0  HGB 11.6* 11.1*  HCT 34.5* 32.5*  PLT 224 217    Recent Labs  07/19/16 1725 07/20/16 0451  NA 136 140  K 4.1 4.1  CL 98* 106  CO2 30 30  GLUCOSE 97 98  BUN 28* 22*  CREATININE 1.06* 0.84  CALCIUM 8.4* 8.1*   No results for input(s): LABPROT, INR in the last 72 hours.  Imaging: No results found.  Assessment/Plan: 71 yo female with right lower leg abscess  --Will take to OR today for I&D of right lower leg abscess. --Consent order placed.   Howie IllJose Luis Rhiley Solem, MD [facility]

## 2016-07-22 LAB — BASIC METABOLIC PANEL WITH GFR
Anion gap: 5 (ref 5–15)
BUN: 24 mg/dL — ABNORMAL HIGH (ref 6–20)
CO2: 28 mmol/L (ref 22–32)
Calcium: 8.3 mg/dL — ABNORMAL LOW (ref 8.9–10.3)
Chloride: 104 mmol/L (ref 101–111)
Creatinine, Ser: 0.68 mg/dL (ref 0.44–1.00)
GFR calc Af Amer: >60 mL/min
GFR calc non Af Amer: >60 mL/min
Glucose, Bld: 129 mg/dL — ABNORMAL HIGH (ref 65–99)
Potassium: 4.4 mmol/L (ref 3.5–5.1)
Sodium: 137 mmol/L (ref 135–145)

## 2016-07-22 LAB — VANCOMYCIN, TROUGH: VANCOMYCIN TR: 14 ug/mL — AB (ref 15–20)

## 2016-07-22 MED ORDER — SULFAMETHOXAZOLE-TRIMETHOPRIM 800-160 MG PO TABS: 1.0000 | ORAL_TABLET | Freq: Two times a day (BID) | ORAL | 0 refills | Status: DC

## 2016-07-22 MED ORDER — IBUPROFEN 400 MG PO TABS: 400.0000 mg | ORAL_TABLET | Freq: Three times a day (TID) | ORAL | 0 refills | Status: DC | PRN

## 2016-07-22 MED ORDER — HYDROCODONE-ACETAMINOPHEN 5-325 MG PO TABS: 1.0000 | ORAL_TABLET | Freq: Four times a day (QID) | ORAL | 0 refills | Status: DC | PRN

## 2016-07-22 MED ORDER — VANCOMYCIN HCL 10 G IV SOLR
1250.0000 mg | Freq: Two times a day (BID) | INTRAVENOUS | Status: DC
  Filled 2016-07-22: qty 1250

## 2016-07-22 MED ORDER — SULFAMETHOXAZOLE-TRIMETHOPRIM 800-160 MG PO TABS
1.0000 | ORAL_TABLET | Freq: Two times a day (BID) | ORAL | Status: DC
  Administered 2016-07-22: 1 via ORAL
  Filled 2016-07-22 (×2): qty 1

## 2016-07-22 NOTE — Discharge Instructions (Addendum)
Resume diet and activity as before.  Change outer dressing twice daily. With clean hands and while wearing gloves, remove tape and outer gauze. Replace with dry gauze and and ABD pad. Leave packing inside wound untill you see Dr. Sandi CarneWoodham/Dr. Piscoya in their office on Wednesday.

## 2016-07-22 NOTE — Progress Notes (Signed)
Patient discharged per MD orders.  Advised per Dr. Talmage CoinWoodham's orders, not to drive while taking pain medication. Advised that is OK to shower per Dr. Tonita CongWoodham. Patient voiced that she lived alone and would probably be driving herself, so re-emphasized to her that as long as she was taking pain medication, she should not be driving. We discussed the safety issues related to herself and others if driving while on pain medication.  She verbalized understanding. Reviewed how to reinforce bandage, to leave packing in place until follow up with surgeon and supplies given to last for three days. Patient verbalized understanding. Loel RoVerne H. Berdina Cheever, RN 07/22/16 [phone]

## 2016-07-22 NOTE — Progress Notes (Signed)
1 Day Post-Op   Subjective:  Patient reports that she is feeling better today. Pain now only at the incision site.  Vital signs in last 24 hours: Temp:  [97.7 F (36.5 C)-98.3 F (36.8 C)] 98.3 F (36.8 C) (03/18 0559) Pulse Rate:  [61-66] 66 (03/18 0900) Resp:  [11-20] 20 (03/18 0559) BP: (91-145)/(44-53) 145/53 (03/18 0900) SpO2:  [90 %-97 %] 97 % (03/18 0900) Last BM Date: 07/20/16  Intake/Output from previous day: 03/17 0701 - 03/18 0700 In: 600 [I.V.:600] Out: 5 [Blood:5]  GI: soft, non-tender; bowel sounds normal; no masses,  no organomegaly  Extremities: Right lower extremity examined with packing in place. Ecchymosis present around the incision site without blanching erythema.  Lab Results:  CBC  Recent Labs  07/19/16 1725 07/20/16 0451  WBC 7.5 6.0  HGB 11.6* 11.1*  HCT 34.5* 32.5*  PLT 224 217   CMP     Component Value Date/Time   NA 137 07/22/2016 0720   NA 138 05/25/2012 0458   K 4.4 07/22/2016 0720   K 4.1 10/28/2013 1124   CL 104 07/22/2016 0720   CL 105 05/25/2012 0458   CO2 28 07/22/2016 0720   CO2 26 05/25/2012 0458   GLUCOSE 129 (H) 07/22/2016 0720   GLUCOSE 105 (H) 05/25/2012 0458   BUN 24 (H) 07/22/2016 0720   BUN 17 05/25/2012 0458   CREATININE 0.68 07/22/2016 0720   CREATININE 0.84 05/25/2012 0458   CALCIUM 8.3 (L) 07/22/2016 0720   CALCIUM 8.1 (L) 05/25/2012 0458   PROT 7.1 05/23/2012 2016   ALBUMIN 3.5 05/23/2012 2016   AST 14 (L) 05/23/2012 2016   ALT 17 05/23/2012 2016   ALKPHOS 89 05/23/2012 2016   BILITOT 0.3 05/23/2012 2016   GFRNONAA >60 07/22/2016 0720   GFRNONAA >60 05/25/2012 0458   GFRAA >60 07/22/2016 0720   GFRAA >60 05/25/2012 0458   PT/INR No results for input(s): LABPROT, INR in the last 72 hours.  Studies/Results: No results found.  Assessment/Plan: 71 year old female status post I&D of a right lower extremity infected hematoma. Doing very well. Discussed with the primary team about transitioning to oral  antibiotics. Packing is to stay in place until it is removed in clinic. Okay for discharge from a surgical standpoint once on oral antibiotics. Follow-up in clinic Wednesday of this week.   Ricarda Frameharles Gorje Iyer, MD [facility]  Day [facility] [phone]) [phone] Night [facility] [phone](7p-7a) [phone]  07/22/2016

## 2016-07-22 NOTE — Progress Notes (Signed)
Pharmacy Antibiotic Note  Kathy Ashley Kathy Ashley is a 71 y.o. female admitted on 07/19/2016 with  cellulitis/abscess vs infected hematoma.  Pharmacy has been consulted for vancomycin dosing.  Plan: Ke: 0.053   t1/2: 13   Vd: 60  Will give vancomycin 1gm followed by vancomycin 1250mg  IV every 18 hours with 6 hour stack dosing. Calculated trough at Css is 15. Trough ordered prior to 4th dose. Will monitor renal function daily and adjust dose as needed.    3/18: Vancomycin trough= 14 mcg/ml.  Will increase Vancomycin to 1250 mg Q12h. Scr has improved. S/p I&D of RLL abscess by surgery 3/17. F/u cx. Check trough on 3/20 to follow up for possible accumulation.     Height: 5' 9.6" (176.8 cm) (5.8) Weight: 190 lb 4.8 oz (86.3 kg) IBW/kg (Calculated) : 67.58  Temp (24hrs), Avg:98 F (36.7 C), Min:97.7 F (36.5 C), Max:98.3 F (36.8 C)   Recent Labs Lab 07/19/16 1725 07/20/16 0451 07/22/16 0720  WBC 7.5 6.0  --   CREATININE 1.06* 0.84 0.68  VANCOTROUGH  --   --  14*    Estimated Creatinine Clearance: 77.6 mL/min (by C-G formula based on SCr of 0.68 mg/dL).    Allergies  Allergen Reactions  . Penicillins Rash    Antimicrobials this admission: 3/15 vancomycin  >>  Dose adjustments this admission: 3/18 vanc 1250 q18h change dto 1250mg  q12h  Microbiology results: 3/17 Wound cx:  Pending 3/17 MRSA PCR negative  Thank you for allowing pharmacy to be a part of this patient's care.  Angelique BlonderMerrill,Carrell Rahmani A, PharmD, BCPS Clinical Pharmacist 07/22/2016 9:24 AM

## 2016-07-23 ENCOUNTER — Encounter: Payer: Self-pay | Admitting: Surgery

## 2016-07-25 ENCOUNTER — Ambulatory Visit: Payer: Medicare Other | Admitting: Internal Medicine

## 2016-07-25 ENCOUNTER — Ambulatory Visit (INDEPENDENT_AMBULATORY_CARE_PROVIDER_SITE_OTHER): Payer: Medicare Other | Admitting: Surgery

## 2016-07-25 VITALS — BP 91/61 | HR 73 | Temp 97.8°F | Ht 65.0 in | Wt 185.0 lb

## 2016-07-25 DIAGNOSIS — Z9289 Personal history of other medical treatment: Secondary | ICD-10-CM

## 2016-07-25 DIAGNOSIS — Z09 Encounter for follow-up examination after completed treatment for conditions other than malignant neoplasm: Secondary | ICD-10-CM

## 2016-07-25 DIAGNOSIS — B159 Hepatitis A without hepatic coma: Secondary | ICD-10-CM

## 2016-07-25 DIAGNOSIS — J9819 Other pulmonary collapse: Secondary | ICD-10-CM

## 2016-07-25 DIAGNOSIS — J189 Pneumonia, unspecified organism: Secondary | ICD-10-CM

## 2016-07-25 DIAGNOSIS — R32 Unspecified urinary incontinence: Secondary | ICD-10-CM

## 2016-07-25 DIAGNOSIS — K219 Gastro-esophageal reflux disease without esophagitis: Secondary | ICD-10-CM

## 2016-07-25 DIAGNOSIS — I4891 Unspecified atrial fibrillation: Secondary | ICD-10-CM

## 2016-07-25 DIAGNOSIS — T7840XA Allergy, unspecified, initial encounter: Secondary | ICD-10-CM

## 2016-07-25 DIAGNOSIS — J45909 Unspecified asthma, uncomplicated: Secondary | ICD-10-CM

## 2016-07-25 DIAGNOSIS — J449 Chronic obstructive pulmonary disease, unspecified: Secondary | ICD-10-CM

## 2016-07-25 DIAGNOSIS — Z9889 Other specified postprocedural states: Secondary | ICD-10-CM

## 2016-07-25 DIAGNOSIS — I10 Essential (primary) hypertension: Secondary | ICD-10-CM

## 2016-07-25 NOTE — Patient Instructions (Signed)
We will see you back in 2 weeks. See your appointment below.  Continue changing the packing twice daily. You will want to take off your dressing and take the packing out. Then shower. Dry completely, replace packing, and then replace the dressing on top with a dry dressing and tape.  If you have any increased redness, warmth, or fever; call our office immediately and speak with a nurse.

## 2016-07-25 NOTE — Progress Notes (Signed)
07/25/2016  HPI: Patient is s/p right lower leg I&D on 3/17 for an infected hematoma.  She was discharged on 3/18.  She presents today for follow up.  Reports her pain is doing significantly better and is able to walk well.  Had not been doing packing changes but only changing the outer dressing.  Taking her Bactrim as indicated.  Vital signs: BP 91/61   Pulse 73   Temp 97.8 F (36.6 C) (Oral)   Ht 5\' 5"  (1.651 m)   Wt 83.9 kg (185 lb)   BMI 30.79 kg/m    Physical Exam: Constitutional: No acute distress Extremity:  Right lower leg s/p I&D.  No further cellulitis.  Improved tenderness.  No purulence.  Healthy wound bed granulation.  Wound packed with 1/4 inch gauze strip and dressed with 4x4 gauze  Assessment/Plan: 71 yo female s/p I&D of right lower leg.  --Patient has been instructed that she should be changing the packing and the gauze dressing to the wound twice daily.  She will continue this for the remainder of this week and then change to once daily after that. --Continue antibiotics as indicated --Follow up in 2 weeks for another wound check.   Howie IllJose Luis Teng Decou, MD [facility]

## 2016-07-26 ENCOUNTER — Encounter: Payer: Self-pay | Admitting: Surgery

## 2016-07-26 LAB — AEROBIC/ANAEROBIC CULTURE (SURGICAL/DEEP WOUND): CULTURE: NO GROWTH

## 2016-07-26 LAB — AEROBIC/ANAEROBIC CULTURE W GRAM STAIN (SURGICAL/DEEP WOUND)

## 2016-07-26 NOTE — Anesthesia Postprocedure Evaluation (Signed)
Anesthesia Post Note  Patient: Kathy Ashley  Procedure(s) Performed: Procedure(s) (LRB): INCISION AND DRAINAGE ABSCESS Right Lower Leg (Right)  Patient location during evaluation: PACU Anesthesia Type: General Level of consciousness: awake and alert Pain management: pain level controlled Vital Signs Assessment: post-procedure vital signs reviewed and stable Respiratory status: spontaneous breathing, nonlabored ventilation, respiratory function stable and patient connected to nasal cannula oxygen Cardiovascular status: blood pressure returned to baseline and stable Postop Assessment: no signs of nausea or vomiting Anesthetic complications: no     Last Vitals:  Vitals:   07/22/16 0559 07/22/16 0900  BP: (!) 121/46 (!) 145/53  Pulse: 65 66  Resp: 20   Temp: 36.8 C     Last Pain:  Vitals:   07/22/16 1059  TempSrc:   PainSc: 2                  Lenard SimmerAndrew Abdalla Naramore

## 2016-08-01 NOTE — Discharge Summary (Signed)
[employer] [facility] [place] [facility]   PATIENT NAME: Kathy Ashley    MR#:  [id number]  DATE OF BIRTH:  [birth year]  DATE OF ADMISSION:  07/19/2016 ADMITTING PHYSICIAN: Houston Siren, MD  DATE OF DISCHARGE: 07/22/2016  2:01 PM  PRIMARY CARE PHYSICIAN: Margaretann Loveless, MD   ADMISSION DIAGNOSIS:  wound infection Cellulitis Right Lower Leg Abscess  DISCHARGE DIAGNOSIS:  Active Problems:   Cellulitis   SECONDARY DIAGNOSIS:   Past Medical History:  Diagnosis Date  . Allergy   . Asthma   . Atrial fibrillation (HCC)   . Collapsed lung 2016  . COPD (chronic obstructive pulmonary disease) (HCC)   . GERD (gastroesophageal reflux disease)   . Hepatitis A 2016  . Hypertension 2005  . Pneumonia 2016  . Seasonal allergies   . Urinary incontinence      ADMITTING HISTORY   HISTORY OF PRESENT ILLNESS:  Kathy Ashley  is a 71 y.o. female with a known history of Atrial fibrillation, hypertension and depression who presents to the hospital due to right lower extremity redness, swelling and pain. Patient had a fall last Wednesday and develop some bruising on her right lower extremity. The breathing got significantly worse and she had some pain in the right lower extremity and went to see her primary care physician. Patient was noted to have some localized erythema and bruising, she was empirically placed on clindamycin for suspected cellulitis and sent home from her primary care physician's office. She returns back today as the pain in the right lower extremity is significantly worse with worsening redness and swelling. She was directly admitted from the primary care physician's office for cellulitis having failed oral antibiotics. Patient admits to some chills but no documented fever. She denies any chest pain, shortness of breath, nausea vomiting abdominal pain fever or any other associated symptoms.   HOSPITAL COURSE:   71 year old female with past medical history of chronic  atrial fibrillation, hypertension, depression, presented to the hospital after he traumatic fall a week ago noted to have significant right lower extremity redness swelling and pain.  1. Right lower extremity redness swelling and pain infected hematoma with overlying cellulitis  On IV abx in hospital. Change to bactrim at discharge Appreciate Surgery input. Daily dressing change with no change of packing till clinic f/u per Dr. Tonita Cong Pain improved  2. History of chronic atrial fibrillation-continue amiodarone, Toprol. Eliquis held in hospital. Restart after 1 day of discharge  3. Essentialhypertension-continue Toprol, losartan.  4. Neuropathy - cont. Neurontin  5. Depression - cont. Effexor, Trazodone.   Stable for discharge home  CONSULTS OBTAINED:  Treatment Team:  Henrene Dodge, MD  DRUG ALLERGIES:   Allergies  Allergen Reactions  . Penicillins Hives    DISCHARGE MEDICATIONS:   Discharge Medication List as of 07/22/2016 11:56 AM    START taking these medications   Details  HYDROcodone-acetaminophen (NORCO) 5-325 MG tablet Take 1 tablet by mouth every 6 (six) hours as needed for severe pain., Starting Sun 07/22/2016, Print    ibuprofen (ADVIL,MOTRIN) 400 MG tablet Take 1 tablet (400 mg total) by mouth every 8 (eight) hours as needed for moderate pain., Starting Sun 07/22/2016, Normal    sulfamethoxazole-trimethoprim (BACTRIM DS,SEPTRA DS) 800-160 MG tablet Take 1 tablet by mouth 2 (two) times daily., Starting Sun 07/22/2016, Normal      CONTINUE these medications which have NOT CHANGED   Details  amiodarone (PACERONE) 200 MG tablet Take by mouth daily. , Starting Tue 02/07/2016, Historical  Med    apixaban (ELIQUIS) 5 MG TABS tablet Take 5 mg by mouth 2 (two) times daily., Historical Med    fluticasone furoate-vilanterol (BREO ELLIPTA) 100-25 MCG/INH AEPB Inhale into the lungs., Historical Med    furosemide (LASIX) 20 MG tablet Take 20 mg by mouth 2 (two) times  daily. , Starting Mon 05/11/2013, Historical Med    gabapentin (NEURONTIN) 300 MG capsule 300 mg at bedtime. , Starting Wed 08/10/2015, Historical Med    losartan (COZAAR) 25 MG tablet Take 25 mg by mouth daily. , Starting Mon 07/18/2015, Historical Med    montelukast (SINGULAIR) 10 MG tablet Take 10 mg by mouth daily., Starting 06/23/2014, Until Discontinued, Historical Med    Omega-3 Fatty Acids (FISH OIL) 1200 MG CAPS Take 1 capsule by mouth 2 (two) times daily. , Historical Med    traZODone (DESYREL) 100 MG tablet Take 100 mg by mouth at bedtime. , Starting Thu 06/16/2015, Historical Med    venlafaxine XR (EFFEXOR-XR) 75 MG 24 hr capsule Take 75 mg by mouth daily with breakfast., Historical Med    meloxicam (MOBIC) 15 MG tablet Take 15 mg by mouth daily as needed for pain., Historical Med    pravastatin (PRAVACHOL) 20 MG tablet Take 20 mg by mouth daily., Historical Med      STOP taking these medications     clindamycin (CLEOCIN) 150 MG capsule      Venlafaxine HCl 75 MG TB24      albuterol (PROVENTIL) (2.5 MG/3ML) 0.083% nebulizer solution      metoprolol succinate (TOPROL-XL) 50 MG 24 hr tablet         Today   VITAL SIGNS:  Blood pressure (!) 145/53, pulse 66, temperature 98.3 F (36.8 C), temperature source Oral, resp. rate 20, height 5' 9.6" (1.768 m), weight 86.3 kg (190 lb 4.8 oz), SpO2 97 %.  I/O:  No intake or output data in the 24 hours ending 08/01/16 1118  PHYSICAL EXAMINATION:  Physical Exam  GENERAL:  71 y.o.-year-old patient lying in the bed with no acute distress.  LUNGS: Normal breath sounds bilaterally, no wheezing, rales,rhonchi or crepitation. No use of accessory muscles of respiration.  CARDIOVASCULAR: S1, S2 normal. No murmurs, rubs, or gallops.  ABDOMEN: Soft, non-tender, non-distended. Bowel sounds present. No organomegaly or mass.  NEUROLOGIC: Moves all 4 extremities. PSYCHIATRIC: The patient is alert and oriented x 3.  SKIN: Right leg dressing  intact  DATA REVIEW:   CBC No results for input(s): WBC, HGB, HCT, PLT in the last 168 hours.  Chemistries  No results for input(s): NA, K, CL, CO2, GLUCOSE, BUN, CREATININE, CALCIUM, MG, AST, ALT, ALKPHOS, BILITOT in the last 168 hours.  Invalid input(s): GFRCGP  Cardiac Enzymes No results for input(s): TROPONINI in the last 168 hours.  Microbiology Results  Results for orders placed or performed during the hospital encounter of 07/19/16  MRSA PCR Screening     Status: None   Collection Time: 07/21/16  7:50 AM  Result Value Ref Range Status   MRSA by PCR NEGATIVE NEGATIVE Final    Comment:        The GeneXpert MRSA Assay (FDA approved for NASAL specimens only), is one component of a comprehensive MRSA colonization surveillance program. It is not intended to diagnose MRSA infection nor to guide or monitor treatment for MRSA infections.   Aerobic/Anaerobic Culture (surgical/deep wound)     Status: None   Collection Time: 07/21/16 10:35 AM  Result Value Ref Range Status  Specimen Description ABSCESS  Final   Special Requests RIGHT LOWER LEG ABSCESS  Final   Gram Stain   Final    FEW WBC PRESENT, PREDOMINANTLY PMN NO ORGANISMS SEEN    Culture   Final    No growth aerobically or anaerobically. Performed at [facility], [address] [place], [place] [address]    Report Status 07/26/2016 FINAL  Final    RADIOLOGY:  No results found.  Follow up with PCP in 1 week.  Management plans discussed with the patient, family and they are in agreement.  CODE STATUS:  Code Status History    Date Active Date Inactive Code Status Order ID Comments User Context   07/19/2016  2:27 PM 07/19/2016  2:39 PM Full Code [id number]  Houston SirenVivek J Sainani, MD Inpatient      TOTAL TIME TAKING CARE OF THIS PATIENT ON DAY OF DISCHARGE: more than 30 minutes.   Milagros LollSudini, Jessalynn Mccowan R M.D on 08/01/2016 at 11:18 AM  Between 7am to 6pm - Pager - [phone]  After 6pm go to [email or web address] -  password [email or web address] [facility]  [facility] [place] [facility]  Office  [phone]  CC: Primary care physician; Margaretann LovelessKHAN,NEELAM S, MD  Note: This dictation was prepared with Dragon dictation along with smaller phrase technology. Any transcriptional errors that result from this process are unintentional.

## 2016-08-09 ENCOUNTER — Ambulatory Visit (INDEPENDENT_AMBULATORY_CARE_PROVIDER_SITE_OTHER): Payer: Medicare Other | Admitting: Surgery

## 2016-08-09 ENCOUNTER — Encounter: Payer: Self-pay | Admitting: Surgery

## 2016-08-09 VITALS — BP 129/72 | HR 82 | Temp 98.0°F | Ht 69.0 in | Wt 184.6 lb

## 2016-08-09 DIAGNOSIS — Z09 Encounter for follow-up examination after completed treatment for conditions other than malignant neoplasm: Secondary | ICD-10-CM

## 2016-08-09 NOTE — Progress Notes (Signed)
08/09/2016  HPI: Patient is s/p I&D of right lower leg infected hematoma on 3/17.  She presents for 2nd post-op check.  She continues to do well, with minimal discomfort over the wound.  She's doing daily dressing changes and has noticed the wound becoming smaller.  Still having some serous drainage but denies any purulence.  Vital signs: BP 129/72   Pulse 82   Temp 98 F (36.7 C) (Oral)   Ht  (1.753 m)   Wt 83.7 kg (184 lb 9.6 oz)   BMI 27.26 kg/m    Physical Exam: Constitutional: No acute distress Extremities:  Right lower leg wound is smaller in size and has become more shallow now compared to prior check.  There is no cellulitis or purulence, and the wound bed is healthy and clean.  Removed at bedside some scabbing around the edges of the wound to provide more oxygenation to that area.  Dressed with dry gauze and tape.  Assessment/Plan: 71 yo female s/p I&D of right lower leg infected hematoma.  --continue dressing changes daily. Patient has been increasing her activity level.  She can do workouts now as tolerated. --Patient will follow in 2 more weeks for another wound check.   Howie Ill, MD [facility]

## 2016-08-09 NOTE — Patient Instructions (Signed)
Please keep the wound dry and covered. Please call our office if you have questions or concerns. Please see your follow up appointment listed below.

## 2016-08-23 ENCOUNTER — Encounter: Payer: Medicare Other | Admitting: Surgery

## 2016-08-30 ENCOUNTER — Encounter: Payer: Self-pay | Admitting: Surgery

## 2016-08-30 ENCOUNTER — Ambulatory Visit (INDEPENDENT_AMBULATORY_CARE_PROVIDER_SITE_OTHER): Payer: Medicare Other | Admitting: Surgery

## 2016-08-30 VITALS — BP 105/67 | HR 86 | Temp 98.0°F | Ht 69.0 in | Wt 180.4 lb

## 2016-08-30 DIAGNOSIS — Z09 Encounter for follow-up examination after completed treatment for conditions other than malignant neoplasm: Secondary | ICD-10-CM

## 2016-08-30 NOTE — Patient Instructions (Signed)
Please apply the triple antibiotic ointment to the wound daily and cover with a large band aid.  Please continue to keep the wound clean and dry. Please call our office if you have any questions or concerns.

## 2016-08-30 NOTE — Progress Notes (Signed)
08/30/2016  HPI: Patient is s/p I&D of right lower leg infected hematoma  on 3/17.  Presents for further follow up.  Reports that her wound continues to heal well and it's smaller and more shallow.  Denies any further pain or drainage.  Vital signs: BP 105/67   Pulse 86   Temp 98 F (36.7 C) (Oral)   Ht  (1.753 m)   Wt 81.8 kg (180 lb 6.4 oz)   BMI 26.64 kg/m    Physical Exam: Constitutional: No acute distress Extremities:  Right lower leg wound with a scab that was removed at bedside.  Wound very superficial, measuring approximately 3 x 2 cm.  Healthy granulation tissue present.  No drainage.  No erythema or induration.  Applied triple antibiotic over wound bed and covered with bandaid.  Assessment/Plan: 71 yo female s/p I&D of right lower leg infected hematoma.  --Continue triple antibiotic ointment over wound and cover with gauze or bandaid once daily.  Would healing very well without further infection. --Patient may follow up with [place] on an as needed basis.   Howie Ill, MD [facility]

## 2016-11-13 ENCOUNTER — Other Ambulatory Visit: Payer: Self-pay | Admitting: Unknown Physician Specialty

## 2016-11-13 DIAGNOSIS — R05 Cough: Secondary | ICD-10-CM

## 2016-11-13 DIAGNOSIS — R059 Cough, unspecified: Secondary | ICD-10-CM

## 2016-11-20 ENCOUNTER — Ambulatory Visit
Admission: RE | Admit: 2016-11-20 | Discharge: 2016-11-20 | Disposition: A | Discharge status: Home / Self Care | Payer: Medicare Other | Source: Ambulatory Visit | Attending: Unknown Physician Specialty | Admitting: Unknown Physician Specialty

## 2016-11-20 DIAGNOSIS — R918 Other nonspecific abnormal finding of lung field: Secondary | ICD-10-CM | POA: Diagnosis not present

## 2016-11-20 DIAGNOSIS — Z8701 Personal history of pneumonia (recurrent): Secondary | ICD-10-CM | POA: Diagnosis not present

## 2016-11-20 DIAGNOSIS — R05 Cough: Secondary | ICD-10-CM | POA: Insufficient documentation

## 2016-11-20 DIAGNOSIS — I700 Atherosclerosis of aorta: Secondary | ICD-10-CM | POA: Insufficient documentation

## 2016-11-20 DIAGNOSIS — J9811 Atelectasis: Secondary | ICD-10-CM | POA: Insufficient documentation

## 2016-11-20 DIAGNOSIS — R059 Cough, unspecified: Secondary | ICD-10-CM

## 2016-11-20 LAB — POCT I-STAT CREATININE: Creatinine, Ser: 0.9 mg/dL (ref 0.44–1.00)

## 2016-11-20 MED ORDER — IOPAMIDOL (ISOVUE-300) INJECTION 61%
75.0000 mL | Freq: Once | INTRAVENOUS | Status: AC | PRN
  Administered 2016-11-20: 75 mL via INTRAVENOUS

## 2017-02-09 ENCOUNTER — Emergency Department: Payer: Medicare Other

## 2017-02-09 ENCOUNTER — Encounter: Payer: Self-pay | Admitting: Emergency Medicine

## 2017-02-09 DIAGNOSIS — I4891 Unspecified atrial fibrillation: Secondary | ICD-10-CM | POA: Diagnosis not present

## 2017-02-09 DIAGNOSIS — J449 Chronic obstructive pulmonary disease, unspecified: Secondary | ICD-10-CM | POA: Diagnosis not present

## 2017-02-09 DIAGNOSIS — Z79899 Other long term (current) drug therapy: Secondary | ICD-10-CM | POA: Insufficient documentation

## 2017-02-09 DIAGNOSIS — I10 Essential (primary) hypertension: Secondary | ICD-10-CM | POA: Insufficient documentation

## 2017-02-09 DIAGNOSIS — M25562 Pain in left knee: Secondary | ICD-10-CM | POA: Diagnosis present

## 2017-02-09 MED ORDER — IBUPROFEN 400 MG PO TABS
ORAL_TABLET | ORAL | Status: AC
  Filled 2017-02-09: qty 1

## 2017-02-09 MED ORDER — IBUPROFEN 400 MG PO TABS
400.0000 mg | ORAL_TABLET | Freq: Once | ORAL | Status: AC | PRN
  Administered 2017-02-09: 400 mg via ORAL

## 2017-02-09 NOTE — ED Triage Notes (Signed)
Patient states that she was sitting tonight and developed left knee pain. Patient denies any injury. Patient with some swelling present to left knee. Patient with positive pedal pulses.

## 2017-02-10 ENCOUNTER — Emergency Department
Admission: EM | Admit: 2017-02-10 | Discharge: 2017-02-10 | Disposition: A | Discharge status: Home / Self Care | Payer: Medicare Other | Attending: Emergency Medicine | Admitting: Emergency Medicine

## 2017-02-10 DIAGNOSIS — M25562 Pain in left knee: Secondary | ICD-10-CM | POA: Diagnosis not present

## 2017-02-10 MED ORDER — DOCUSATE SODIUM 100 MG PO CAPS: ORAL_CAPSULE | ORAL | 0 refills | Status: DC

## 2017-02-10 MED ORDER — OXYCODONE-ACETAMINOPHEN 5-325 MG PO TABS
ORAL_TABLET | ORAL | Status: AC
  Administered 2017-02-10: 2 via ORAL
  Filled 2017-02-10: qty 2

## 2017-02-10 MED ORDER — HYDROCODONE-ACETAMINOPHEN 5-325 MG PO TABS: 1.0000 | ORAL_TABLET | Freq: Four times a day (QID) | ORAL | 0 refills | Status: DC | PRN

## 2017-02-10 MED ORDER — OXYCODONE-ACETAMINOPHEN 5-325 MG PO TABS
2.0000 | ORAL_TABLET | Freq: Once | ORAL | Status: AC
  Administered 2017-02-10: 2 via ORAL

## 2017-02-10 NOTE — Discharge Instructions (Signed)
As we discussed, I am not certain exactly what is causing your pain and swelling tonight.  Given that you are on a blood thinner, it is possible you are having a relatively minor bleed into or around your knee (called a hemarthrosis) which is resulting in the pain and swelling.  Regardless, it is relatively minor at this time and we believe you are safe to go home and follow-up with an orthopedic surgeon.  Please call the office on Monday and explain you were seen in the emergency department over the weekend and need a follow-up visit and that you are currently in a knee immobilizer.  Please use it as needed, but if you are feeling better before you follow-up with orthopedics you can try taking it off.  Please read through the included information about RICE - rest, ice, compression, and elevation.    Take Norco as prescribed for severe pain. Do not drink alcohol, drive or participate in any other potentially dangerous activities while taking this medication as it may make you sleepy. Do not take this medication with any other sedating medications, either prescription or over-the-counter. If you were prescribed Percocet or Vicodin, do not take these with acetaminophen (Tylenol) as it is already contained within these medications.   This medication is an opiate (or narcotic) pain medication and can be habit forming.  Use it as little as possible to achieve adequate pain control.  Do not use or use it with extreme caution if you have a history of opiate abuse or dependence.  If you are on a pain contract with your primary care doctor or a pain specialist, be sure to let them know you were prescribed this medication today from the [facility] Emergency Department.  This medication is intended for your use only - do not give any to anyone else and keep it in a secure place where nobody else, especially children, have access to it.  It will also cause or worsen constipation, so you may want to consider taking  an over-the-counter stool softener while you are taking this medication.  Return to the emergency department if you develop new or worsening symptoms that concern you.

## 2017-02-10 NOTE — ED Provider Notes (Signed)
[facility] Emergency Department Provider Note  ____________________________________________   First MD Initiated Contact with Patient 02/10/17 0250     (approximate)  I have reviewed the triage vital signs and the nursing notes.   HISTORY  Chief Complaint Knee Pain    HPI Kathy Ashley is a 71 y.o. female whose medical history includes a-fib on warfarin for anticoagulation .  She presents for evaluation of acute onset severe pain and mild swelling to left knee.  Developed while she was at a dinner function at which she kept her right leg crossed over her left knee for an extended period of time.  Gradually developed pain which continued to worsen even after she straightened her legs.  Initially she was able to bear weight, but as pain worsened, she could no longer do so.  Swelling and tenderness present at the top medial aspect and bottom lateral side of patella.  No pain in the back of the knee (popliteal fossa), and no pain radiating into calf or up into thigh.  No similar history.  No other symptoms including no fever/chills, CP, SOB, N/V/D.   Past Medical History:  Diagnosis Date  . Allergy   . Asthma   . Atrial fibrillation (HCC)   . Collapsed lung 2016  . COPD (chronic obstructive pulmonary disease) (HCC)   . Hepatitis A 2016  . Hypertension 2005  . Pneumonia 2016  . Seasonal allergies   . Urinary incontinence     Patient Active Problem List   Diagnosis Date Noted  . Urinary incontinence   . Allergy   . Asthma   . COPD (chronic obstructive pulmonary disease) (HCC)   . GERD (gastroesophageal reflux disease)   . Atrial fibrillation (HCC)   . Cellulitis 07/19/2016  . History of cardioversion 02/09/2016  . DDD (degenerative disc disease), lumbar 11/02/2014  . Hip bursitis, left 11/02/2014  . Hepatitis A 05/07/2014  . Collapsed lung 05/07/2014  . Pneumonia 05/07/2014  . Hypertension 05/08/2003    Past Surgical History:    Procedure Laterality Date  . BREAST BIOPSY Right 2002   neg  . CARDIOVERSION  02/09/2016   Due to A-fib/ Dr Burman Riis  . CESAREAN SECTION    . COLONOSCOPY  [id number]  . DILATION AND CURETTAGE OF UTERUS    . ELECTROPHYSIOLOGIC STUDY N/A 02/09/2016   Procedure: CARDIOVERSION;  Surgeon: Laurier Nancy, MD;  Location: [facility] ORS;  Service: Cardiovascular;  Laterality: N/A;  . INCISION AND DRAINAGE ABSCESS Right 07/21/2016   Procedure: INCISION AND DRAINAGE ABSCESS Right Lower Leg;  Surgeon: Henrene Dodge, MD;  Location: [facility] ORS;  Service: General;  Laterality: Right;  . TONSILLECTOMY      Prior to Admission medications   Medication Sig Start Date End Date Taking? Authorizing Provider  amiodarone (PACERONE) 200 MG tablet Take by mouth daily.  02/07/16   [provider]  apixaban (ELIQUIS) 5 MG TABS tablet Take 5 mg by mouth 2 (two) times daily.    [provider]  benzonatate (TESSALON) 100 MG capsule TAKE 1 CAPSULE BY MOUTH THREE TIMES A DAY 08/27/16   [provider]  cefdinir (OMNICEF) 300 MG capsule  08/28/16   [provider]  clindamycin (CLEOCIN) 150 MG capsule Take 1 capsule by mouth 3 (three) times daily. 07/18/16   [provider]  docusate sodium (COLACE) 100 MG capsule Take 1 tablet once or twice daily as needed for constipation while taking narcotic pain medicine 02/10/17   Loleta Rose,  MD  fluticasone furoate-vilanterol (BREO ELLIPTA) 100-25 MCG/INH AEPB Inhale into the lungs.    [provider]  furosemide (LASIX) 20 MG tablet Take 20 mg by mouth 2 (two) times daily.  05/11/13   [provider]  HYDROcodone-acetaminophen (NORCO/VICODIN) 5-325 MG tablet Take 1-2 tablets by mouth every 6 (six) hours as needed for moderate pain. 02/10/17   Loleta Rose, MD  ibuprofen (ADVIL,MOTRIN) 400 MG tablet Take 1 tablet (400 mg total) by mouth every 8 (eight) hours as needed for moderate pain. 07/22/16   Milagros Loll, MD  levofloxacin  (LEVAQUIN) 500 MG tablet  08/21/16   [provider]  levothyroxine (SYNTHROID, LEVOTHROID) 50 MCG tablet  08/14/16   [provider]  losartan (COZAAR) 25 MG tablet Take 25 mg by mouth daily.  07/18/15   [provider]  montelukast (SINGULAIR) 10 MG tablet Take 10 mg by mouth daily. 06/23/14   [provider]  Omega-3 Fatty Acids (FISH OIL) 1200 MG CAPS Take 1 capsule by mouth 2 (two) times daily.     [provider]  rosuvastatin (CRESTOR) 10 MG tablet Take 1 tablet by mouth at bedtime. 07/11/16   [provider]  traZODone (DESYREL) 100 MG tablet Take 100 mg by mouth at bedtime.  06/16/15   [provider]  venlafaxine XR (EFFEXOR-XR) 75 MG 24 hr capsule Take 75 mg by mouth daily with breakfast.    [provider]    Allergies Penicillins  Family History  Problem Relation Age of Onset  . Diabetes Father   . Heart disease Mother   . Hypertension Mother   . Stroke Mother   . Breast cancer Neg Hx     Social History Social History  Substance Use Topics  . Smoking status: Never Smoker  . Smokeless tobacco: Never Used  . Alcohol use Yes     Comment: wine occasionally    Review of Systems Constitutional: No fever/chills Cardiovascular: Denies chest pain. Respiratory: Denies shortness of breath. Gastrointestinal: No abdominal pain.  No nausea, no vomiting.  No diarrhea.  No constipation. Genitourinary: Negative for dysuria. Musculoskeletal: Acute onset pain in left knee with some swelling Integumentary: Negative for rash. Neurological: Negative for headaches, focal weakness or numbness.   ____________________________________________   PHYSICAL EXAM:  VITAL SIGNS: ED Triage Vitals  Enc Vitals Group     BP 02/09/17 2305 (!) 121/41     Pulse Rate 02/09/17 2305 81     Resp 02/09/17 2305 18     Temp 02/09/17 2305 98 F (36.7 C)     Temp Source 02/09/17 2305 Oral     SpO2 02/09/17 2305 100 %     Weight  02/09/17 2306 79.4 kg (175 lb)     Height 02/09/17 2306 1.727 m ( )     Head Circumference --      Peak Flow --      Pain Score 02/09/17 2306 10     Pain Loc --      Pain Edu? --      Excl. in GC? --     Constitutional: Alert and oriented. Well appearing and in no acute distress. Neck: No stridor.  No meningeal signs.   Cardiovascular: Normal rate, regular rhythm. Good peripheral circulation.  Respiratory: Normal respiratory effort.  No retractions.  Musculoskeletal: Mild swelling/effusion around patella, most obvious in the superior medial aspect of the knee.  No ecchymosis.  No erythema/excessive warmth.  No restriction of flexion/extension except due to pain.  No joint laxity.  No popliteal tenderness nor palpable cord-like structions.  N/V intact distally. Neurologic:  Normal speech and language. No gross focal neurologic deficits are appreciated.  Skin:  Skin is warm, dry and intact. No rash noted. Psychiatric: Mood and affect are normal. Speech and behavior are normal.  ____________________________________________   LABS (all labs ordered are listed, but only abnormal results are displayed)  Labs Reviewed - No data to display ____________________________________________  EKG  No EKG ordered by ED physician ____________________________________________  RADIOLOGY   Dg Knee Complete 4 Views Left  Result Date: 02/09/2017 CLINICAL DATA:  Pain throughout entire knee this evening while sitting at table with legs crossed, unable to fully straighten EXAM: LEFT KNEE - COMPLETE 4+ VIEW COMPARISON:  None FINDINGS: Diffuse osseous demineralization. Minimal joint space narrowing. No acute fracture, dislocation, or bone destruction. Patellar spur at quadriceps tendon insertion. No knee joint effusion. IMPRESSION: No acute osseous abnormalities. Electronically Signed   By: Ulyses Southward M.D.   On: 02/09/2017 23:46     ____________________________________________   PROCEDURES  Critical Care performed: No   Procedure(s) performed:   Procedures   ____________________________________________   INITIAL IMPRESSION / ASSESSMENT AND PLAN / ED COURSE     Diff dx includes DVT, septic arthritis, tendon/ligament injury, knee strain/sprain, spontaneous hemearthrosis, etc.  Strongly doubt DVT given location of symptoms and the fact that she is on warfarin.  Most likely strain/sprain but hemearthrosis also possible.  Discussed management at length with patient, and due to her concerns about instability and the use of crutches, we decided upon knee immobilizer and close follow up with Dr. Rosita Kea, whom she has seen in the past.  Discussed RICE recommendations.  Gave strict return precautions.  She understands and agrees with the plan.     ____________________________________________  FINAL CLINICAL IMPRESSION(S) / ED DIAGNOSES  Final diagnoses:  Acute pain of left knee     MEDICATIONS GIVEN DURING THIS VISIT:  Medications  ibuprofen (ADVIL,MOTRIN) tablet 400 mg (400 mg Oral Given 02/09/17 2312)  oxyCODONE-acetaminophen (PERCOCET/ROXICET) 5-325 MG per tablet 2 tablet (2 tablets Oral Given 02/10/17 0149)     NEW OUTPATIENT MEDICATIONS STARTED DURING THIS VISIT:  Discharge Medication List as of 02/10/2017  4:35 AM    START taking these medications   Details  docusate sodium (COLACE) 100 MG capsule Take 1 tablet once or twice daily as needed for constipation while taking narcotic pain medicine, Print    HYDROcodone-acetaminophen (NORCO/VICODIN) 5-325 MG tablet Take 1-2 tablets by mouth every 6 (six) hours as needed for moderate pain., Starting Sun 02/10/2017, Print        Discharge Medication List as of 02/10/2017  4:35 AM      Discharge Medication List as of 02/10/2017  4:35 AM       Note:  This document was prepared using Dragon voice recognition software and may include unintentional  dictation errors.    Loleta Rose, MD 02/10/17 1006

## 2017-02-12 ENCOUNTER — Other Ambulatory Visit
Admission: RE | Admit: 2017-02-12 | Discharge: 2017-02-12 | Disposition: A | Discharge status: Home / Self Care | Payer: Medicare Other | Source: Ambulatory Visit | Attending: Orthopedic Surgery | Admitting: Orthopedic Surgery

## 2017-02-12 DIAGNOSIS — M25462 Effusion, left knee: Secondary | ICD-10-CM | POA: Diagnosis present

## 2017-02-12 LAB — SYNOVIAL CELL COUNT + DIFF, W/ CRYSTALS
Crystals, Fluid: NONE SEEN
EOSINOPHILS-SYNOVIAL: 0 %
LYMPHOCYTES-SYNOVIAL FLD: 7 %
Monocyte-Macrophage-Synovial Fluid: 9 %
NEUTROPHIL, SYNOVIAL: 84 %
OTHER CELLS-SYN: 0
WBC, SYNOVIAL: 37837 /mm3 — AB (ref 0–200)

## 2017-02-16 LAB — BODY FLUID CULTURE: CULTURE: NO GROWTH

## 2017-11-15 ENCOUNTER — Ambulatory Visit
Admission: RE | Admit: 2017-11-15 | Discharge: 2017-11-15 | Disposition: A | Discharge status: Home / Self Care | Payer: Medicare Other | Source: Ambulatory Visit | Attending: Internal Medicine | Admitting: Internal Medicine

## 2017-11-15 ENCOUNTER — Other Ambulatory Visit: Payer: Self-pay | Admitting: Internal Medicine

## 2017-11-15 DIAGNOSIS — I700 Atherosclerosis of aorta: Secondary | ICD-10-CM | POA: Insufficient documentation

## 2017-11-15 DIAGNOSIS — R109 Unspecified abdominal pain: Secondary | ICD-10-CM

## 2018-05-04 IMAGING — CR DG KNEE COMPLETE 4+V*L*
1 series · 5 of 5 positions shown · non-contrast
Comparison: None

CLINICAL DATA: Pain throughout entire knee this evening while
sitting at table with legs crossed, unable to fully straighten

EXAM:
LEFT KNEE - COMPLETE 4+ VIEW

[Series 1: dg knee complete 4 views left · 0.14mm/px · 5 of 5 slices shown]
[im 1/5]
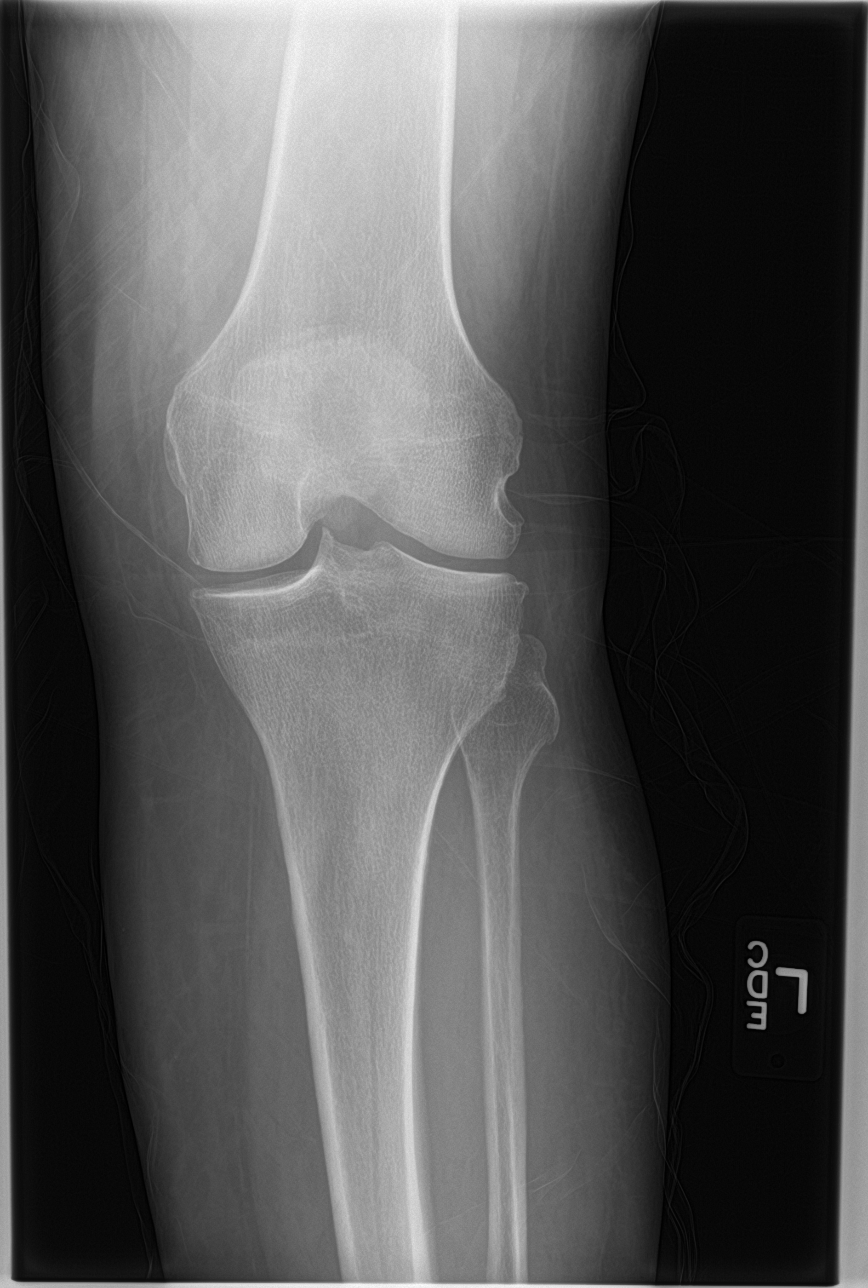
[im 2/5]
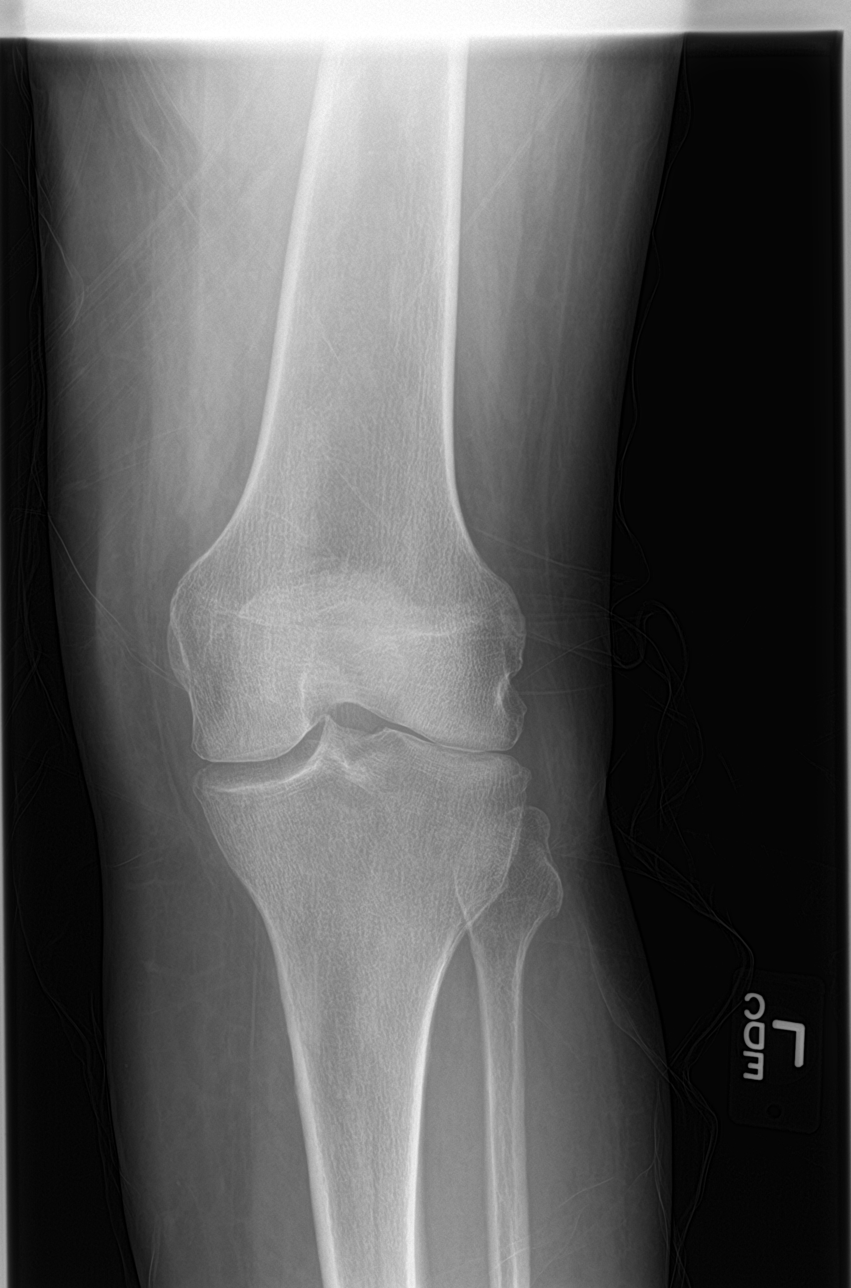
[im 3/5]
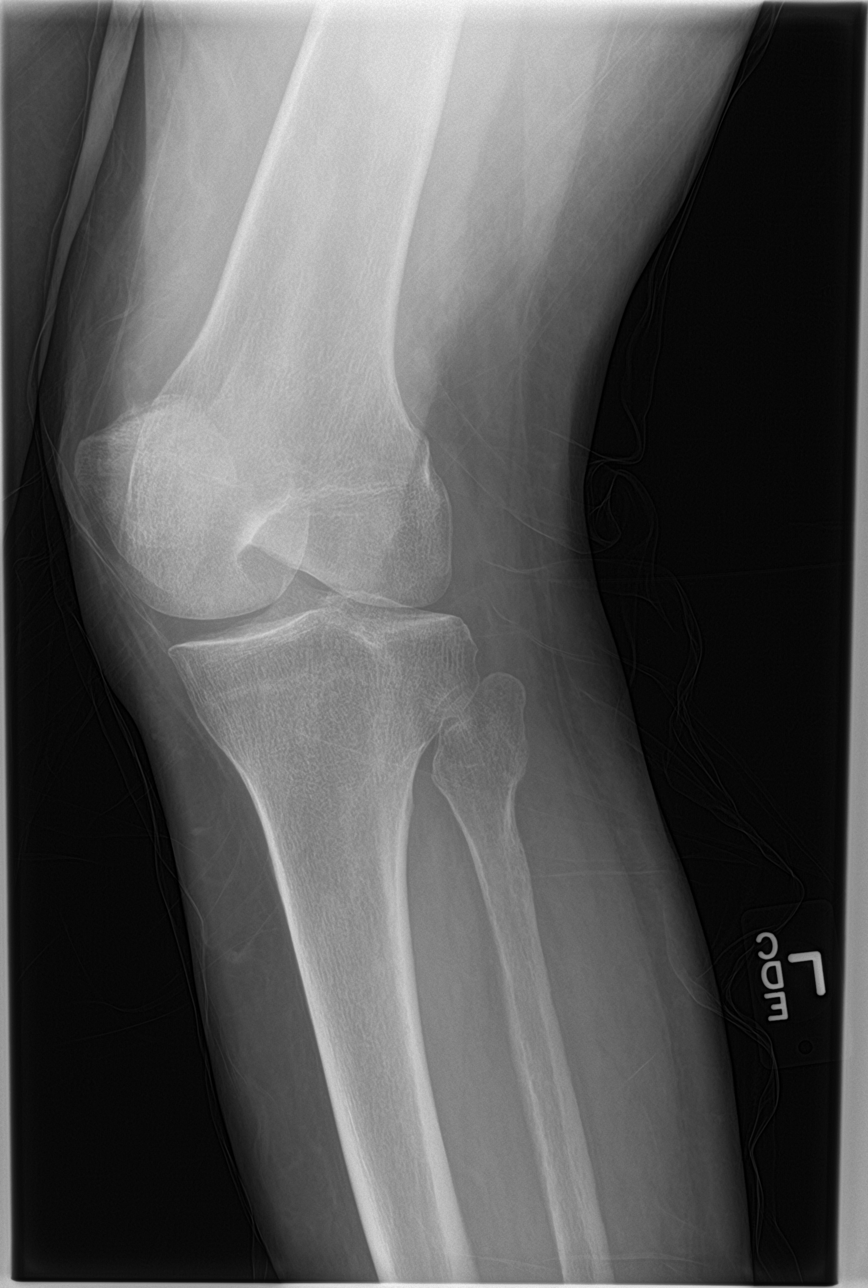
[im 4/5]
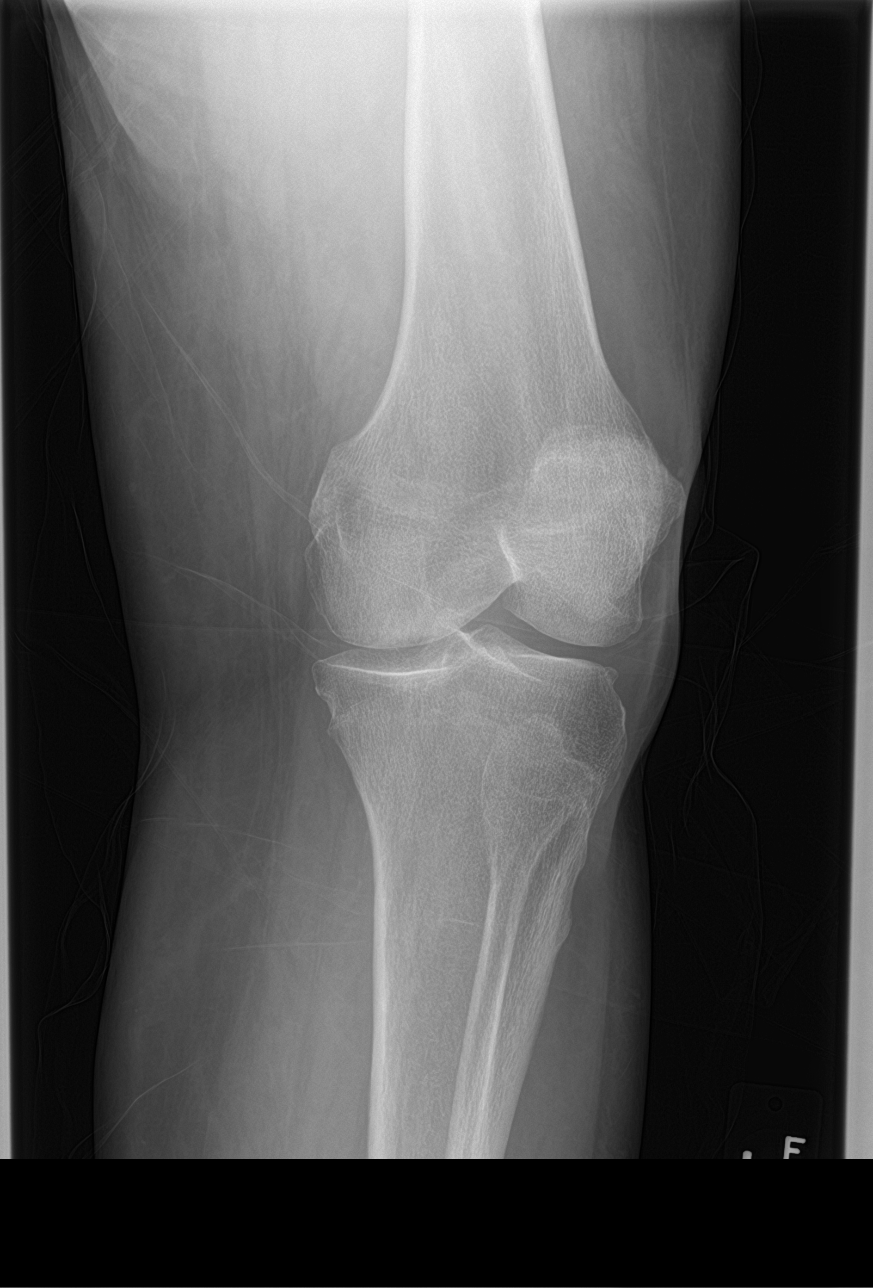
[im 5/5]
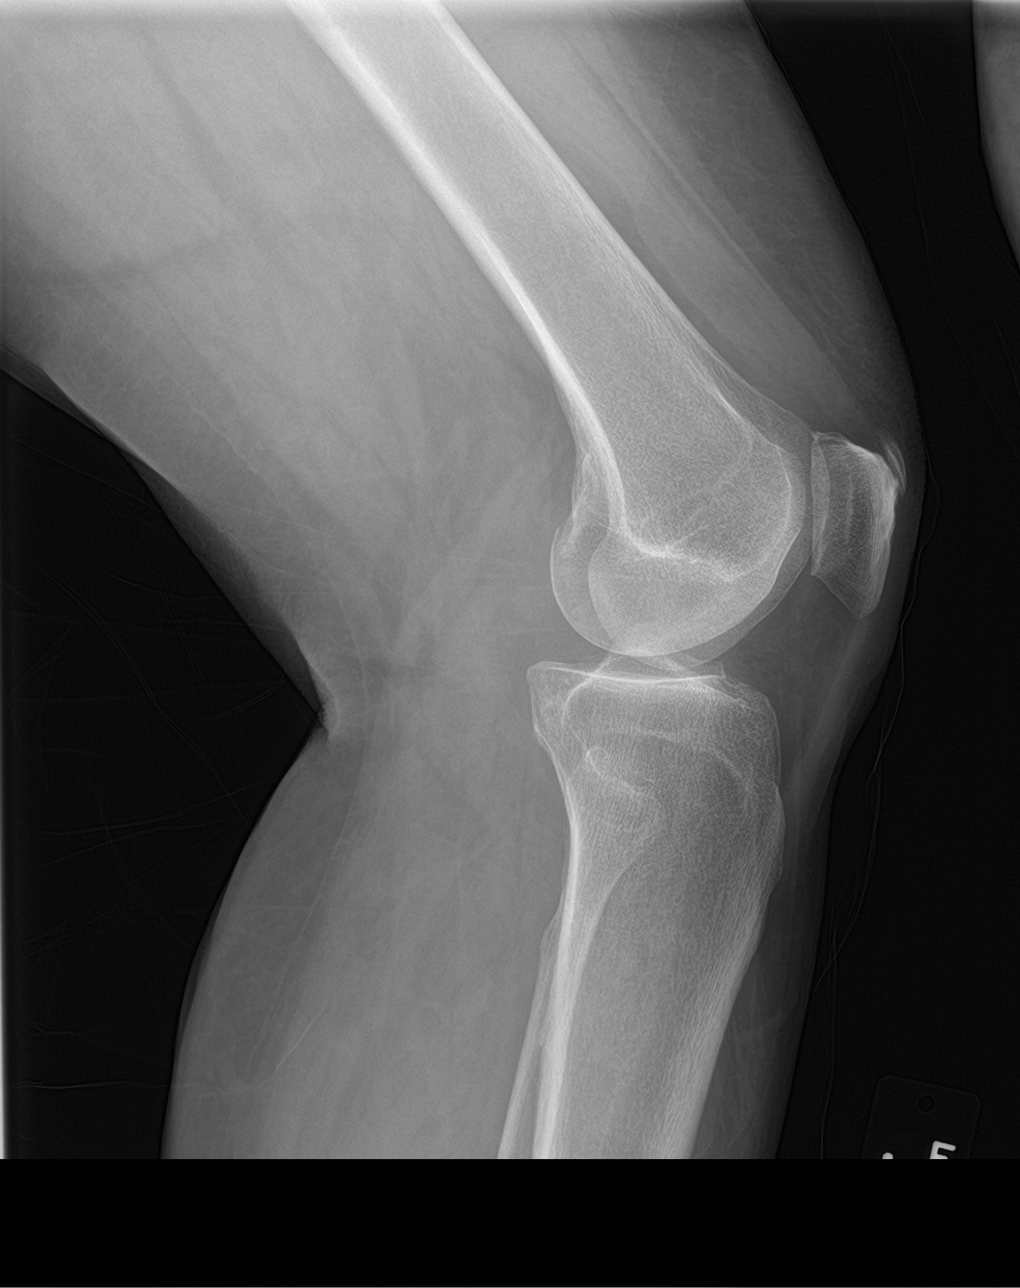

[5 of 5 positions shown; findings below may reference images not displayed]

FINDINGS: Diffuse osseous demineralization.

Minimal joint space narrowing.

No acute fracture, dislocation, or bone destruction.

Patellar spur at quadriceps tendon insertion.

No knee joint effusion.
IMPRESSION: No acute osseous abnormalities.

## 2018-08-19 IMAGING — US US ABDOMEN COMPLETE
1 series · 14 of 25 positions shown · non-contrast
Comparison: None.

CLINICAL DATA: Abdominal pain for 5 days.

EXAM:
ABDOMEN ULTRASOUND COMPLETE

[Series 1: us abdomen complete · 0.20mm/px · 14 of 96 slices shown]
[im 1/96]
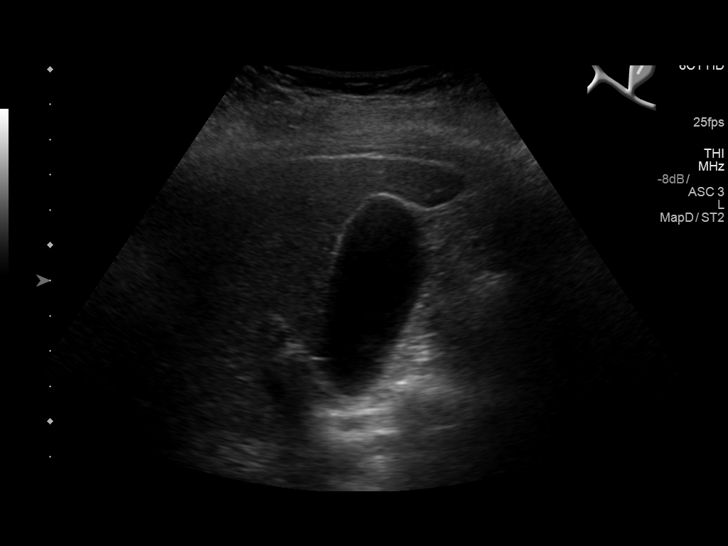
[im 8/96]
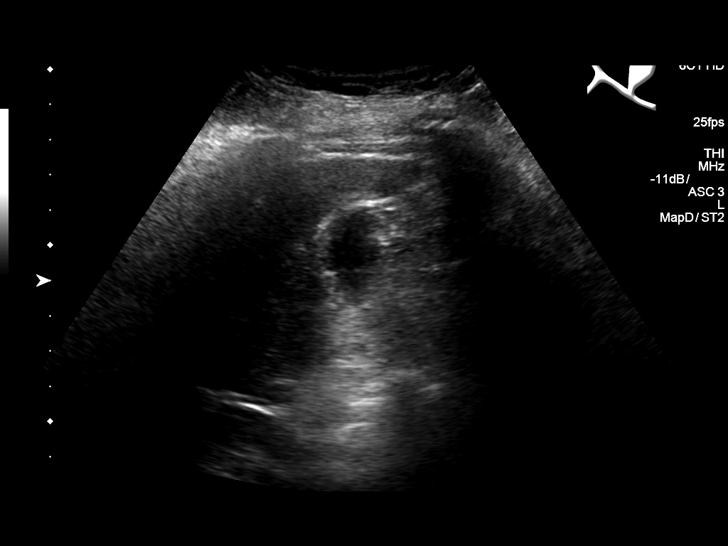
[im 16/96]
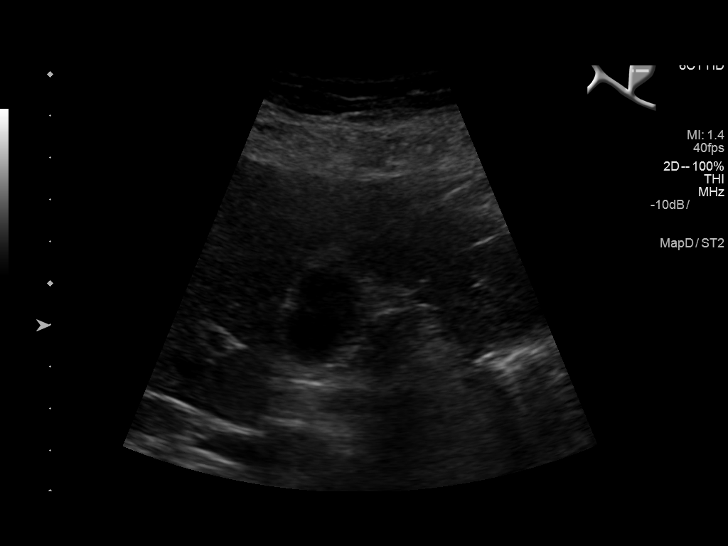
[im 24/96]
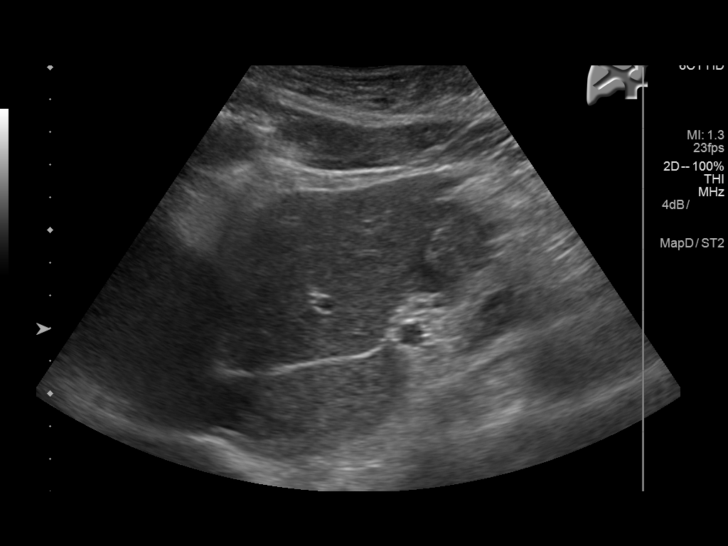
[im 32/96]
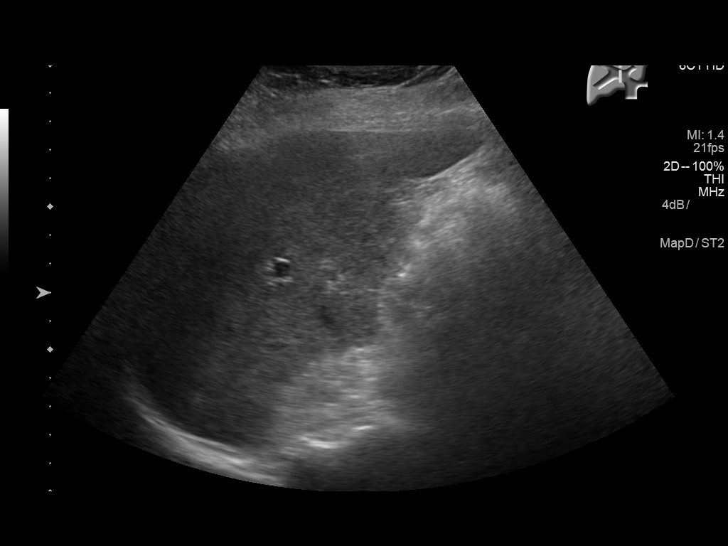
[im 36/96]
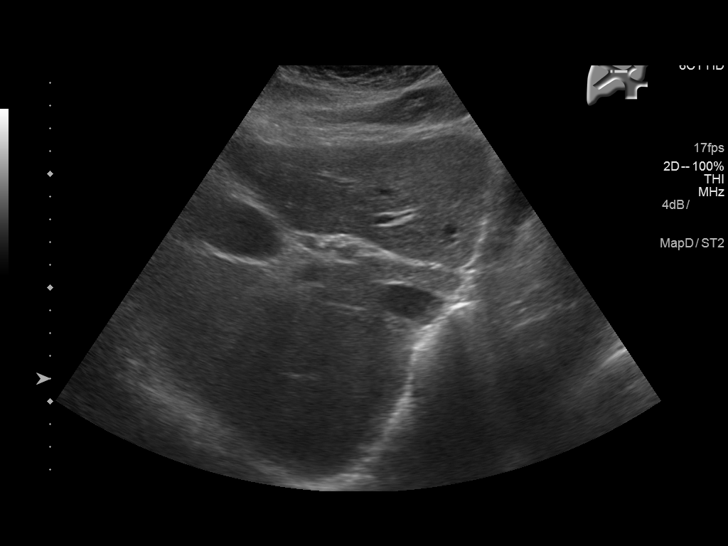
[im 44/96]
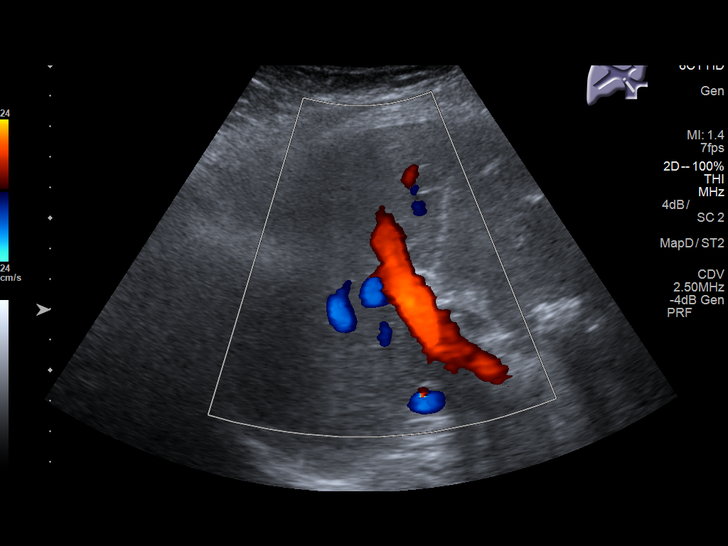
[im 52/96]
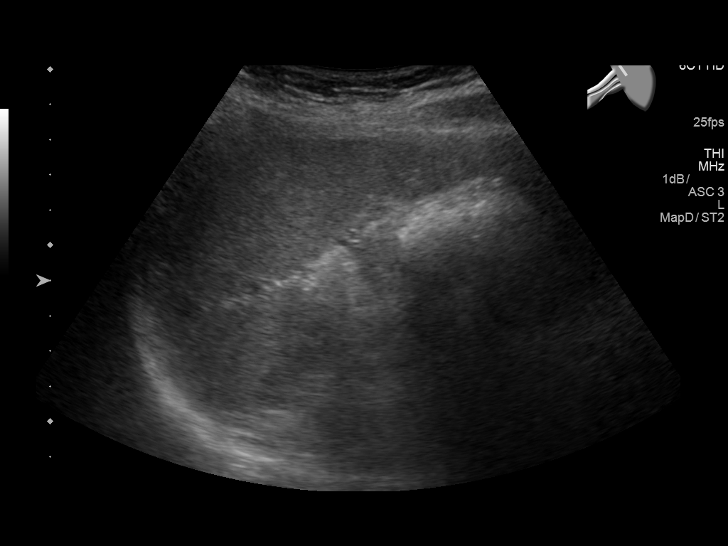
[im 60/96]
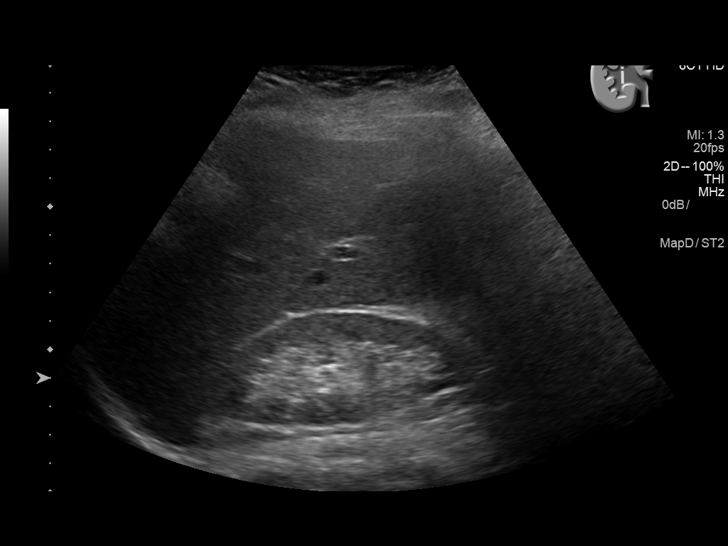
[im 64/96]
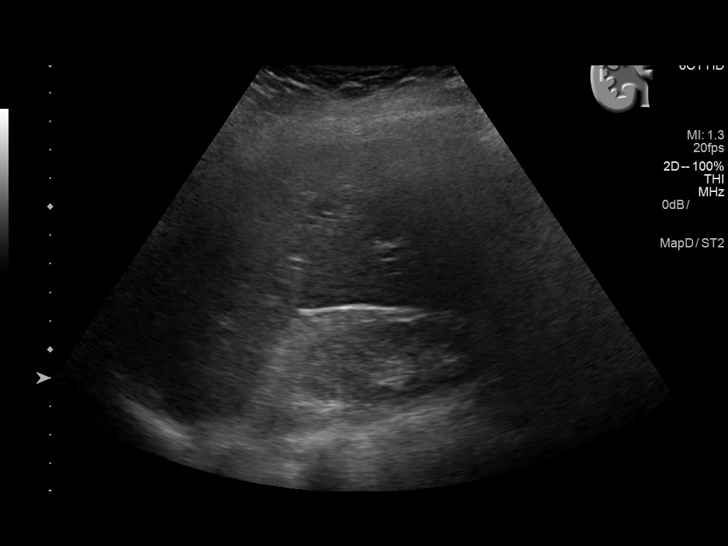
[im 72/96]
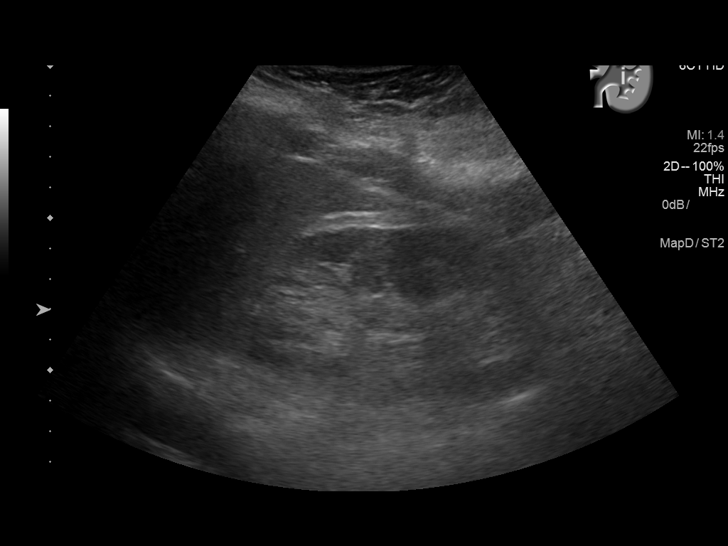
[im 80/96]
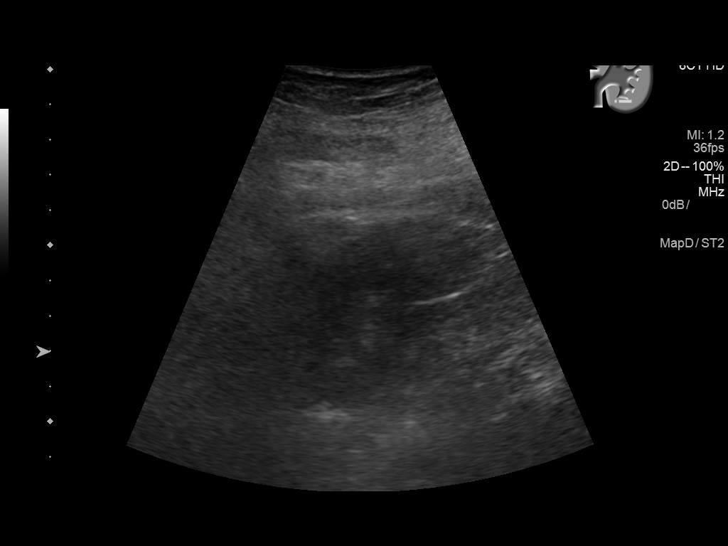
[im 88/96]
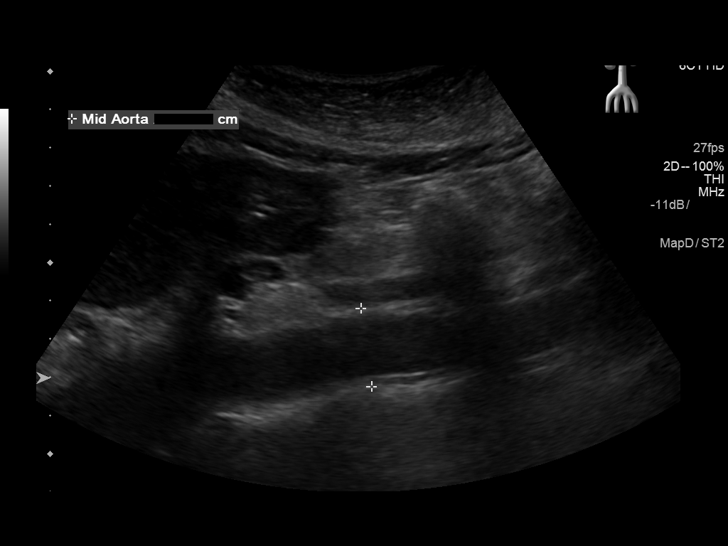
[im 96/96]
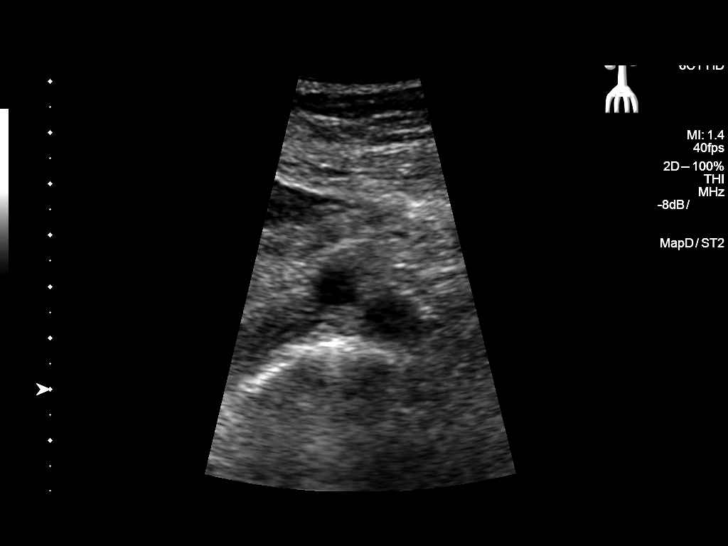

[14 of 25 positions shown; findings below may reference images not displayed]

FINDINGS: Gallbladder: No gallstones or wall thickening visualized. No
sonographic Murphy sign noted by sonographer.

Common bile duct: Diameter: 6 mm

Liver: No focal lesion identified. Within normal limits in
parenchymal echogenicity. Portal vein is patent on color Doppler
imaging with normal direction of blood flow towards the liver.

IVC: No abnormality visualized.

Pancreas: Visualized portion unremarkable.

Spleen: Size and appearance within normal limits.

Right Kidney: Length: 9.5 cm. Echogenicity within normal limits. No
mass or hydronephrosis visualized.

Left Kidney: Length: 9.8 cm. Echogenicity within normal limits. No
mass or hydronephrosis visualized.

Abdominal aorta: No aneurysm visualized.  Atherosclerosis noted.

Other findings: None.
IMPRESSION: 1. Essentially normal abdomen ultrasound.  No acute findings.
2. Aortic atherosclerosis.  No aortic aneurysm.

## 2019-05-29 ENCOUNTER — Ambulatory Visit: Payer: Medicare Other | Attending: Internal Medicine

## 2019-05-29 DIAGNOSIS — Z23 Encounter for immunization: Secondary | ICD-10-CM | POA: Insufficient documentation

## 2019-05-29 NOTE — Progress Notes (Signed)
   Covid-19 Vaccination Clinic  Name:  Kathy Ashley    MRN: [id number] DOB: 1945-11-11  05/29/2019  Kathy Ashley was observed post Covid-19 immunization for 30 minutes without incidence. She was provided with Vaccine Information Sheet and instruction to access the V-Safe system.   Kathy Ashley was instructed to call 911 with any severe reactions post vaccine: Marland Kitchen Difficulty breathing  . Swelling of your face and throat  . A fast heartbeat  . A bad rash all over your body  . Dizziness and weakness    Immunizations Administered    Name Date Dose VIS Date Route   Pfizer COVID-19 Vaccine 05/29/2019  2:10 PM 0.3 mL 04/17/2019 Intramuscular   Manufacturer: [employer], [employer]   Lot: [id number]   NDC: [address]-6

## 2019-06-16 ENCOUNTER — Ambulatory Visit: Payer: Medicare Other | Attending: Internal Medicine

## 2019-06-16 DIAGNOSIS — Z23 Encounter for immunization: Secondary | ICD-10-CM | POA: Insufficient documentation

## 2019-06-16 NOTE — Progress Notes (Signed)
   Covid-19 Vaccination Clinic  Name:  Kathy Ashley    MRN: [id number] DOB: 05/25/1945  06/16/2019  Ms. [name] was observed post Covid-19 immunization for 15 minutes without incidence. She was provided with Vaccine Information Sheet and instruction to access the V-Safe system.   Ms. [name] was instructed to call 911 with any severe reactions post vaccine: Marland Kitchen Difficulty breathing  . Swelling of your face and throat  . A fast heartbeat  . A bad rash all over your body  . Dizziness and weakness    Immunizations Administered    Name Date Dose VIS Date Route   Pfizer COVID-19 Vaccine 06/16/2019  8:39 AM 0.3 mL 04/17/2019 Intramuscular   Manufacturer: [employer], [employer]   Lot: [id number]   NDC: [address]-7

## 2021-09-14 ENCOUNTER — Other Ambulatory Visit: Payer: Self-pay | Admitting: Internal Medicine

## 2021-09-14 DIAGNOSIS — R102 Pelvic and perineal pain: Secondary | ICD-10-CM

## 2021-09-15 ENCOUNTER — Ambulatory Visit
Admission: RE | Admit: 2021-09-15 | Discharge: 2021-09-15 | Disposition: A | Discharge status: Home / Self Care | Payer: Medicare Other | Source: Ambulatory Visit | Attending: Internal Medicine | Admitting: Internal Medicine

## 2021-09-15 DIAGNOSIS — R102 Pelvic and perineal pain: Secondary | ICD-10-CM | POA: Diagnosis present

## 2022-06-11 ENCOUNTER — Other Ambulatory Visit: Payer: Self-pay | Admitting: Internal Medicine

## 2022-06-11 ENCOUNTER — Encounter: Payer: Self-pay | Admitting: Internal Medicine

## 2022-06-11 ENCOUNTER — Ambulatory Visit: Payer: Self-pay

## 2022-06-11 ENCOUNTER — Other Ambulatory Visit: Payer: Self-pay

## 2022-06-11 ENCOUNTER — Ambulatory Visit (INDEPENDENT_AMBULATORY_CARE_PROVIDER_SITE_OTHER): Payer: Medicare Other

## 2022-06-11 ENCOUNTER — Ambulatory Visit (INDEPENDENT_AMBULATORY_CARE_PROVIDER_SITE_OTHER): Payer: Medicare Other | Admitting: Internal Medicine

## 2022-06-11 VITALS — BP 118/74 | HR 76 | Ht 68.0 in | Wt 171.0 lb

## 2022-06-11 DIAGNOSIS — R053 Chronic cough: Secondary | ICD-10-CM | POA: Insufficient documentation

## 2022-06-11 DIAGNOSIS — J189 Pneumonia, unspecified organism: Secondary | ICD-10-CM

## 2022-06-11 DIAGNOSIS — J13 Pneumonia due to Streptococcus pneumoniae: Secondary | ICD-10-CM | POA: Diagnosis not present

## 2022-06-11 DIAGNOSIS — K219 Gastro-esophageal reflux disease without esophagitis: Secondary | ICD-10-CM | POA: Diagnosis not present

## 2022-06-11 DIAGNOSIS — I482 Chronic atrial fibrillation, unspecified: Secondary | ICD-10-CM | POA: Diagnosis not present

## 2022-06-11 DIAGNOSIS — J438 Other emphysema: Secondary | ICD-10-CM

## 2022-06-11 MED ORDER — PROMETHAZINE HCL 12.5 MG PO TABS: 12.5000 mg | ORAL_TABLET | Freq: Three times a day (TID) | ORAL | 0 refills | Status: DC | PRN

## 2022-06-11 NOTE — Progress Notes (Signed)
   Subjective:    Patient ID: Kathy Ashley, female    DOB: 1945/12/29, 77 y.o.   MRN: [id number]  Patient comes in for follow-up of recently diagnosed influenza A infections and pneumonia.  Patient has been traveling and was visiting family in [place] when she developed a cough for a week and she started to get worse went to a local urgent care, where she was diagnosed with influenza A infection and the chest x-ray also showed right-sided pneumonia this happened on May 30, 2022.  Patient was prescribed Tamiflu and levofloxacin which she has been taking and completed the course however today she says she is feeling better but still has a cough and chest congestion.  Patient has been using the inhaler that they have prescribed.  No more fevers or chills.  No no nausea vomiting or diarrhea.  She still has some body aches.  Will get a follow-up chest x-ray for pneumonia today.  And I will also prescribe promethazine for her cough control.      Review of Systems  Constitutional: Negative.   HENT:  Positive for congestion. Negative for postnasal drip, sore throat and trouble swallowing.   Eyes: Negative.   Respiratory:  Positive for cough, chest tightness and shortness of breath. Negative for choking.   Gastrointestinal: Negative.   Endocrine: Negative.   Musculoskeletal: Negative.   Skin: Negative.   Allergic/Immunologic: Negative.   Neurological: Negative.   Psychiatric/Behavioral: Negative.         Objective:   Physical Exam Constitutional:      Appearance: Normal appearance.  HENT:     Head: Normocephalic.     Nose: Nose normal.  Eyes:     Pupils: Pupils are equal, round, and reactive to light.  Cardiovascular:     Rate and Rhythm: Normal rate and regular rhythm.     Pulses: Normal pulses.  Pulmonary:     Effort: Pulmonary effort is normal.     Breath sounds: Normal breath sounds.  Abdominal:     General: Abdomen is flat. Bowel sounds are normal.     Palpations:  Abdomen is soft.  Musculoskeletal:        General: Normal range of motion.     Cervical back: Normal range of motion.  Skin:    General: Skin is warm.  Neurological:     General: No focal deficit present.           Assessment & Plan:  Kathy Ashley was seen today for follow-up.  Diagnoses and all orders for this visit:  Pneumonia of right upper lobe due to Streptococcus pneumoniae [facility]) -     DG Chest 2 [place] was seen today for follow-up.  Diagnoses and all orders for this visit:  Pneumonia of right upper lobe due to Streptococcus pneumoniae ([place]) -     Cancel: DG Chest 2 View -     DG Chest 2 View -     promethazine (PHENERGAN) 12.5 MG tablet; Take 1 tablet (12.5 mg total) by mouth every 8 (eight) hours as needed for nausea or vomiting.  Cough, persistent -     promethazine (PHENERGAN) 12.5 MG tablet; Take 1 tablet (12.5 mg total) by mouth every 8 (eight) hours as needed for nausea or vomiting.  Gastroesophageal reflux disease without esophagitis  Chronic atrial fibrillation (HCC)  Other emphysema ([place])   Patient will return for follow up in 10 days.  Perrin Maltese, MD

## 2022-06-21 ENCOUNTER — Encounter: Payer: Self-pay | Admitting: Internal Medicine

## 2022-06-21 ENCOUNTER — Ambulatory Visit (INDEPENDENT_AMBULATORY_CARE_PROVIDER_SITE_OTHER): Payer: Medicare Other | Admitting: Internal Medicine

## 2022-06-21 VITALS — BP 120/72 | HR 72 | Ht 68.0 in | Wt 173.4 lb

## 2022-06-21 DIAGNOSIS — I10 Essential (primary) hypertension: Secondary | ICD-10-CM

## 2022-06-21 DIAGNOSIS — J410 Simple chronic bronchitis: Secondary | ICD-10-CM | POA: Diagnosis not present

## 2022-06-21 DIAGNOSIS — I482 Chronic atrial fibrillation, unspecified: Secondary | ICD-10-CM

## 2022-06-21 DIAGNOSIS — R053 Chronic cough: Secondary | ICD-10-CM

## 2022-06-21 MED ORDER — GUAIFENESIN-CODEINE 100-10 MG/5ML PO SYRP: 5.0000 mL | ORAL_SOLUTION | Freq: Every day | ORAL | 0 refills | Status: DC

## 2022-06-21 MED ORDER — METHYLPREDNISOLONE 4 MG PO TBPK: ORAL_TABLET | ORAL | 0 refills | Status: DC

## 2022-06-21 NOTE — Progress Notes (Signed)
Established Patient Office Visit  Subjective:  Patient ID: Kathy Ashley, female    DOB: May 12, 1945  Age: 77 y.o. MRN: [id number]  Chief Complaint  Patient presents with   Follow-up    10 day follow up    Patient comes in today for her 10-day follow-up.  She was recently seen for a follow-up of her Pneumonia and Flu for which was treated  in [place].  She she was still complaining of chest congestion and a productive cough with white sputum, malaise, fatigue.  Repeat chest x-ray was done at that time and it was found to be normal.  Patient was given a prescription for promethazine to help control her cough. Today patient says that she is still coughing with clear sputum not getting enough rest.  She was not able to take Phenergan because it caused her to be very sleepy.  So she stopped using it.  Today she feels tired from excessive coughing, but does not have any fevers or chills no shortness of breath.  And she is using her nebulizers. Will send in a prescription for the Medrol Dosepak, and Codeine cough medicine to be used only at night.     Past Medical History:  Diagnosis Date   Allergy    Asthma    Atrial fibrillation ([place])    Collapsed lung 2016   COPD (chronic obstructive pulmonary disease) ([place])    Hepatitis A 2016   Hypertension 2005   Pneumonia 2016   Seasonal allergies    Urinary incontinence     Social History   Socioeconomic History   Marital status: Divorced    Spouse name: Not on file   Number of children: Not on file   Years of education: Not on file   Highest education level: Not on file  Occupational History   Occupation: Retired  Tobacco Use   Smoking status: Never   Smokeless tobacco: Never  Substance and Sexual Activity   Alcohol use: Yes    Comment: wine occasionally   Drug use: No   Sexual activity: Not on file  Other Topics Concern   Not on file  Social History Narrative   Not on file   Social Determinants of Health    Financial Resource Strain: Not on file  Food Insecurity: Not on file  Transportation Needs: Not on file  Physical Activity: Not on file  Stress: Not on file  Social Connections: Not on file  Intimate Partner Violence: Not on file    Family History  Problem Relation Age of Onset   Diabetes Father    Heart disease Mother    Hypertension Mother    Stroke Mother    Breast cancer Neg Hx     Allergies  Allergen Reactions   Penicillins Hives    Review of Systems  Constitutional:  Positive for malaise/fatigue. Negative for chills, diaphoresis, fever and weight loss.  HENT:  Negative for congestion, ear pain, hearing loss, nosebleeds and tinnitus.   Eyes:  Negative for blurred vision and double vision.  Respiratory:  Positive for cough, sputum production, shortness of breath and wheezing. Negative for hemoptysis.   Cardiovascular:  Negative for chest pain, palpitations, orthopnea and leg swelling.  Gastrointestinal:  Negative for abdominal pain, constipation, diarrhea, heartburn, nausea and vomiting.  Genitourinary:  Negative for dysuria, frequency and urgency.  Musculoskeletal:  Negative for back pain, falls, myalgias and neck pain.  Skin:  Negative for rash.  Neurological:  Negative for dizziness, tingling and headaches.  Psychiatric/Behavioral:  Negative for depression, hallucinations and suicidal ideas. The patient is not nervous/anxious.        Objective:   BP 120/72   Pulse 72   Ht 5' 8"$  (1.727 m)   Wt 173 lb 6.4 oz (78.7 kg)   SpO2 98%   BMI 26.37 kg/m   Vitals:   06/21/22 1301  BP: 120/72  Pulse: 72  Height: 5' 8"$  (1.727 m)  Weight: 173 lb 6.4 oz (78.7 kg)  SpO2: 98%  BMI (Calculated): 26.37    Physical Exam Vitals and nursing note reviewed.  Constitutional:      Appearance: Normal appearance.  HENT:     Head: Normocephalic.  Cardiovascular:     Rate and Rhythm: Normal rate and regular rhythm.     Pulses: Normal pulses.     Heart sounds: Normal  heart sounds.  Pulmonary:     Effort: Pulmonary effort is normal. No respiratory distress.     Breath sounds: No stridor. Wheezing present. No rhonchi or rales.  Chest:     Chest wall: No tenderness.  Abdominal:     General: Abdomen is flat.     Palpations: Abdomen is soft.  Musculoskeletal:     Cervical back: Normal range of motion and neck supple.  Skin:    General: Skin is warm.  Neurological:     General: No focal deficit present.     Mental Status: She is alert. Mental status is at baseline.      No results found for any visits on 06/21/22.  No results found for this or any previous visit (from the past 2160 hour(s)).    Assessment & Plan:   Problem List Items Addressed This Visit     COPD (chronic obstructive pulmonary disease) ([place]) - Primary   Relevant Medications   methylPREDNISolone (MEDROL DOSEPAK) 4 MG TBPK tablet   guaiFENesin-codeine (ROBITUSSIN AC) 100-10 MG/5ML syrup    Return in about 4 weeks (around 07/19/2022).   Total time spent: 30 minutes  Perrin Maltese, MD  06/21/2022

## 2022-06-22 ENCOUNTER — Telehealth: Payer: Self-pay

## 2022-06-22 NOTE — Telephone Encounter (Signed)
Pharmacy faxed stating the pt's rx cough med isn't available, they asked if you can send an alternate rx for pt? Please advise

## 2022-07-19 ENCOUNTER — Ambulatory Visit (INDEPENDENT_AMBULATORY_CARE_PROVIDER_SITE_OTHER): Payer: Medicare Other | Admitting: Internal Medicine

## 2022-07-19 ENCOUNTER — Encounter: Payer: Self-pay | Admitting: Internal Medicine

## 2022-07-19 VITALS — BP 124/68 | HR 73 | Ht 67.0 in | Wt 171.4 lb

## 2022-07-19 DIAGNOSIS — I482 Chronic atrial fibrillation, unspecified: Secondary | ICD-10-CM

## 2022-07-19 DIAGNOSIS — K219 Gastro-esophageal reflux disease without esophagitis: Secondary | ICD-10-CM

## 2022-07-19 DIAGNOSIS — E782 Mixed hyperlipidemia: Secondary | ICD-10-CM

## 2022-07-19 DIAGNOSIS — J301 Allergic rhinitis due to pollen: Secondary | ICD-10-CM

## 2022-07-19 DIAGNOSIS — J479 Bronchiectasis, uncomplicated: Secondary | ICD-10-CM | POA: Insufficient documentation

## 2022-07-19 DIAGNOSIS — I10 Essential (primary) hypertension: Secondary | ICD-10-CM

## 2022-07-19 DIAGNOSIS — R053 Chronic cough: Secondary | ICD-10-CM | POA: Diagnosis not present

## 2022-07-19 DIAGNOSIS — E039 Hypothyroidism, unspecified: Secondary | ICD-10-CM | POA: Insufficient documentation

## 2022-07-19 NOTE — Progress Notes (Signed)
Established Patient Office Visit  Subjective:  Patient ID: Kathy Ashley, female    DOB: [birth year]  Age: 77 y.o. MRN: [id number]  Chief Complaint  Patient presents with   Follow-up    4 week follow up    Patient comes in for her regular follow-up today.  She was recently seen by her pulmonologist.  She is being treated for chronic cough by them.  She had a CT scan of her chest which showed benign looking nodules, but she will get a repeat follow-up CT scan in 3 months. Today patient says that she is feeling much better but she does have a slight cough because of the excessive pollen outside.  She has been started on a new nasal spray by her pulmonologist.  She is currently taking all her medications.  Patient will come back fasting for her blood work.     Past Medical History:  Diagnosis Date   Allergy    Asthma    Atrial fibrillation ([place])    Collapsed lung 2016   COPD (chronic obstructive pulmonary disease) ([place])    Hepatitis A 2016   Hypertension 2005   Pneumonia 2016   Seasonal allergies    Urinary incontinence     Past Surgical History:  Procedure Laterality Date   BREAST BIOPSY Right 2002   neg   CARDIOVERSION  02/09/2016   Due to A-fib/ Dr Dan Europe   CESAREAN SECTION     COLONOSCOPY  [phone]   DILATION AND CURETTAGE OF UTERUS     ELECTROPHYSIOLOGIC STUDY N/A 02/09/2016   Procedure: CARDIOVERSION;  Surgeon: Dionisio David, MD;  Location: [facility];  Service: Cardiovascular;  Laterality: N/A;   INCISION AND DRAINAGE ABSCESS Right 07/21/2016   Procedure: INCISION AND DRAINAGE ABSCESS Right Lower Leg;  Surgeon: Olean Ree, MD;  Location: [facility];  Service: General;  Laterality: Right;   TONSILLECTOMY      Social History   Socioeconomic History   Marital status: Divorced    Spouse name: Not on file   Number of children: Not on file   Years of education: Not on file   Highest education level: Not on file  Occupational History   Occupation:  Retired  Tobacco Use   Smoking status: Never   Smokeless tobacco: Never  Substance and Sexual Activity   Alcohol use: Yes    Comment: wine occasionally   Drug use: No   Sexual activity: Not on file  Other Topics Concern   Not on file  Social History Narrative   Not on file   Social Determinants of Health   Financial Resource Strain: Not on file  Food Insecurity: Not on file  Transportation Needs: Not on file  Physical Activity: Not on file  Stress: Not on file  Social Connections: Not on file  Intimate Partner Violence: Not on file    Family History  Problem Relation Age of Onset   Diabetes Father    Heart disease Mother    Hypertension Mother    Stroke Mother    Breast cancer Neg Hx     Allergies  Allergen Reactions   Penicillins Hives    Review of Systems  Constitutional:  Negative for chills, diaphoresis, fever, malaise/fatigue and weight loss.  HENT:  Positive for congestion. Negative for ear discharge, ear pain, hearing loss and sore throat.   Eyes: Negative.   Respiratory:  Positive for cough. Negative for hemoptysis, sputum production, shortness of breath, wheezing and stridor.   Cardiovascular:  Negative.   Gastrointestinal: Negative.   Genitourinary: Negative.   Musculoskeletal: Negative.   Neurological: Negative.   Endo/Heme/Allergies: Negative.   Psychiatric/Behavioral: Negative.         Objective:   BP 124/68   Pulse 73   Ht '5\' 7"'$  (1.702 m)   Wt 171 lb 6.4 oz (77.7 kg)   SpO2 97%   BMI 26.85 kg/m   Vitals:   07/19/22 1121  BP: 124/68  Pulse: 73  Height: '5\' 7"'$  (1.702 m)  Weight: 171 lb 6.4 oz (77.7 kg)  SpO2: 97%  BMI (Calculated): 26.84    Physical Exam Vitals and nursing note reviewed.  Constitutional:      Appearance: Normal appearance.  HENT:     Right Ear: Tympanic membrane normal.     Left Ear: Tympanic membrane normal.     Mouth/Throat:     Mouth: Mucous membranes are moist.  Cardiovascular:     Rate and Rhythm:  Normal rate and regular rhythm.     Pulses: Normal pulses.     Heart sounds: Normal heart sounds.  Pulmonary:     Effort: Pulmonary effort is normal.     Breath sounds: Normal breath sounds.  Abdominal:     General: Abdomen is flat.     Palpations: Abdomen is soft.  Musculoskeletal:        General: Normal range of motion.     Cervical back: Normal range of motion and neck supple.  Skin:    General: Skin is warm and dry.  Neurological:     General: No focal deficit present.     Mental Status: She is alert.  Psychiatric:        Mood and Affect: Mood normal.        Behavior: Behavior normal.      No results found for any visits on 07/19/22.  No results found for this or any previous visit (from the past 2160 hour(s)).    Assessment & Plan:  Patient will continue all her medications.  And she will return for her fasting labs. Problem List Items Addressed This Visit     GERD (gastroesophageal reflux disease)   Essential hypertension, benign   Relevant Medications   metoprolol succinate (TOPROL-XL) 25 MG 24 hr tablet   Other Relevant Orders   CMP14+EGFR   Atrial fibrillation (HCC) - Primary   Relevant Medications   metoprolol succinate (TOPROL-XL) 25 MG 24 hr tablet   Other Relevant Orders   CMP14+EGFR   Cough, persistent   Seasonal allergic rhinitis due to pollen   Bronchiectasis without complication (HCC)   Acquired hypothyroidism   Relevant Medications   metoprolol succinate (TOPROL-XL) 25 MG 24 hr tablet   Other Relevant Orders   TSH   Mixed hyperlipidemia   Relevant Medications   metoprolol succinate (TOPROL-XL) 25 MG 24 hr tablet   Other Relevant Orders   Lipid Panel w/o Chol/HDL Ratio    Return in about 4 months (around 11/18/2022).   Total time spent: 30 minutes  Perrin Maltese, MD  07/19/2022

## 2022-07-20 ENCOUNTER — Other Ambulatory Visit: Payer: Self-pay | Admitting: Cardiovascular Disease

## 2022-07-20 DIAGNOSIS — I4892 Unspecified atrial flutter: Secondary | ICD-10-CM

## 2022-08-21 ENCOUNTER — Other Ambulatory Visit: Payer: Self-pay | Admitting: Internal Medicine

## 2022-08-30 ENCOUNTER — Other Ambulatory Visit: Payer: Self-pay | Admitting: Cardiovascular Disease

## 2022-08-30 ENCOUNTER — Other Ambulatory Visit: Payer: Self-pay | Admitting: Internal Medicine

## 2022-08-30 DIAGNOSIS — I480 Paroxysmal atrial fibrillation: Secondary | ICD-10-CM

## 2022-11-15 ENCOUNTER — Other Ambulatory Visit: Payer: Self-pay | Admitting: Internal Medicine

## 2022-11-15 ENCOUNTER — Other Ambulatory Visit: Payer: Self-pay | Admitting: Cardiovascular Disease

## 2022-11-15 DIAGNOSIS — I4892 Unspecified atrial flutter: Secondary | ICD-10-CM

## 2022-11-16 ENCOUNTER — Ambulatory Visit: Payer: Medicare Other | Admitting: Internal Medicine

## 2022-11-21 ENCOUNTER — Other Ambulatory Visit: Payer: Medicare Other

## 2022-11-22 ENCOUNTER — Other Ambulatory Visit: Payer: Self-pay | Admitting: Cardiovascular Disease

## 2022-11-22 LAB — CMP14+EGFR
ALT: 19 [IU]/L (ref 0–32)
AST: 23 [IU]/L (ref 0–40)
Albumin: 4.1 g/dL (ref 3.8–4.8)
Alkaline Phosphatase: 63 [IU]/L (ref 44–121)
BUN/Creatinine Ratio: 14 (ref 12–28)
BUN: 15 mg/dL (ref 8–27)
Bilirubin Total: 0.5 mg/dL (ref 0.0–1.2)
CO2: 25 mmol/L (ref 20–29)
Calcium: 9.1 mg/dL (ref 8.7–10.3)
Chloride: 100 mmol/L (ref 96–106)
Creatinine, Ser: 1.11 mg/dL — ABNORMAL HIGH (ref 0.57–1.00)
Globulin, Total: 2.4 g/dL (ref 1.5–4.5)
Glucose: 89 mg/dL (ref 70–99)
Potassium: 4.6 mmol/L (ref 3.5–5.2)
Sodium: 139 mmol/L (ref 134–144)
Total Protein: 6.5 g/dL (ref 6.0–8.5)
eGFR: 52 mL/min/{1.73_m2} — ABNORMAL LOW

## 2022-11-22 LAB — LIPID PANEL W/O CHOL/HDL RATIO
Cholesterol, Total: 150 mg/dL (ref 100–199)
HDL: 80 mg/dL
LDL Chol Calc (NIH): 55 mg/dL (ref 0–99)
Triglycerides: 81 mg/dL (ref 0–149)
VLDL Cholesterol Cal: 15 mg/dL (ref 5–40)

## 2022-11-22 LAB — TSH: TSH: 1.31 u[IU]/mL (ref 0.450–4.500)

## 2022-11-23 ENCOUNTER — Ambulatory Visit (INDEPENDENT_AMBULATORY_CARE_PROVIDER_SITE_OTHER): Payer: Medicare Other | Admitting: Internal Medicine

## 2022-11-23 ENCOUNTER — Encounter: Payer: Self-pay | Admitting: Internal Medicine

## 2022-11-23 VITALS — BP 114/78 | HR 67 | Ht 67.5 in | Wt 172.4 lb

## 2022-11-23 DIAGNOSIS — J479 Bronchiectasis, uncomplicated: Secondary | ICD-10-CM | POA: Diagnosis not present

## 2022-11-23 DIAGNOSIS — E039 Hypothyroidism, unspecified: Secondary | ICD-10-CM

## 2022-11-23 DIAGNOSIS — R053 Chronic cough: Secondary | ICD-10-CM

## 2022-11-23 DIAGNOSIS — I10 Essential (primary) hypertension: Secondary | ICD-10-CM

## 2022-11-23 DIAGNOSIS — E782 Mixed hyperlipidemia: Secondary | ICD-10-CM

## 2022-11-23 DIAGNOSIS — I482 Chronic atrial fibrillation, unspecified: Secondary | ICD-10-CM

## 2022-11-23 NOTE — Progress Notes (Signed)
Established Patient Office Visit  Subjective:  Patient ID: Kathy Ashley, female    DOB: [birth year]  Age: 77 y.o. MRN: [id number]  Chief Complaint  Patient presents with   Follow-up    4 month follow up    Patient comes in for follow up. Feels very well, and has no new complaints. Cough has improved. Repeat CT chest is in 3 months. Labs discussed. Mammo 06/2022. BMD 2023. Colon due next year.    No other concerns at this time.   Past Medical History:  Diagnosis Date   Allergy    Asthma    Atrial fibrillation (HCC)    Collapsed lung 2016   COPD (chronic obstructive pulmonary disease) (HCC)    Hepatitis A 2016   Hypertension 2005   Pneumonia 2016   Seasonal allergies    Urinary incontinence     Past Surgical History:  Procedure Laterality Date   BREAST BIOPSY Right 2002   neg   CARDIOVERSION  02/09/2016   Due to A-fib/ Dr Burman Riis   CESAREAN SECTION     COLONOSCOPY  [phone]   DILATION AND CURETTAGE OF UTERUS     ELECTROPHYSIOLOGIC STUDY N/A 02/09/2016   Procedure: CARDIOVERSION;  Surgeon: Laurier Nancy, MD;  Location: [facility];  Service: Cardiovascular;  Laterality: N/A;   INCISION AND DRAINAGE ABSCESS Right 07/21/2016   Procedure: INCISION AND DRAINAGE ABSCESS Right Lower Leg;  Surgeon: Henrene Dodge, MD;  Location: [facility];  Service: General;  Laterality: Right;   TONSILLECTOMY      Social History   Socioeconomic History   Marital status: Divorced    Spouse name: Not on file   Number of children: Not on file   Years of education: Not on file   Highest education level: Not on file  Occupational History   Occupation: Retired  Tobacco Use   Smoking status: Never   Smokeless tobacco: Never  Substance and Sexual Activity   Alcohol use: Yes    Comment: wine occasionally   Drug use: No   Sexual activity: Not on file  Other Topics Concern   Not on file  Social History Narrative   Not on file   Social Determinants of Health   Financial  Resource Strain: Not on file  Food Insecurity: Not on file  Transportation Needs: Not on file  Physical Activity: Not on file  Stress: Not on file  Social Connections: Not on file  Intimate Partner Violence: Not on file    Family History  Problem Relation Age of Onset   Diabetes Father    Heart disease Mother    Hypertension Mother    Stroke Mother    Breast cancer Neg Hx     Allergies  Allergen Reactions   Penicillins Hives    Review of Systems  Constitutional: Negative.   HENT: Negative.    Eyes: Negative.   Respiratory: Negative.  Negative for cough and shortness of breath.   Cardiovascular: Negative.  Negative for chest pain, palpitations and leg swelling.  Gastrointestinal: Negative.  Negative for abdominal pain, constipation, diarrhea, heartburn, nausea and vomiting.  Genitourinary: Negative.  Negative for dysuria and flank pain.  Musculoskeletal: Negative.  Negative for joint pain and myalgias.  Skin: Negative.   Neurological: Negative.  Negative for dizziness and headaches.  Endo/Heme/Allergies: Negative.   Psychiatric/Behavioral: Negative.  Negative for depression and suicidal ideas. The patient is not nervous/anxious.        Objective:   BP 114/78   Pulse  67   Ht 5' 7.5" (1.715 m)   Wt 172 lb 6.4 oz (78.2 kg)   SpO2 94%   BMI 26.60 kg/m   Vitals:   11/23/22 1134  BP: 114/78  Pulse: 67  Height: 5' 7.5" (1.715 m)  Weight: 172 lb 6.4 oz (78.2 kg)  SpO2: 94%  BMI (Calculated): 26.59    Physical Exam Vitals and nursing note reviewed.  Constitutional:      Appearance: Normal appearance.  HENT:     Head: Normocephalic and atraumatic.     Nose: Nose normal.     Mouth/Throat:     Mouth: Mucous membranes are moist.     Pharynx: Oropharynx is clear.  Eyes:     Conjunctiva/sclera: Conjunctivae normal.     Pupils: Pupils are equal, round, and reactive to light.  Cardiovascular:     Rate and Rhythm: Normal rate and regular rhythm.     Pulses:  Normal pulses.     Heart sounds: Normal heart sounds. No murmur heard. Pulmonary:     Effort: Pulmonary effort is normal.     Breath sounds: Normal breath sounds. No wheezing.  Abdominal:     General: Bowel sounds are normal.     Palpations: Abdomen is soft.     Tenderness: There is no abdominal tenderness. There is no right CVA tenderness or left CVA tenderness.  Musculoskeletal:        General: Normal range of motion.     Cervical back: Normal range of motion.     Right lower leg: No edema.     Left lower leg: No edema.  Skin:    General: Skin is warm and dry.  Neurological:     General: No focal deficit present.     Mental Status: She is alert and oriented to person, place, and time.  Psychiatric:        Mood and Affect: Mood normal.        Behavior: Behavior normal.      No results found for any visits on 11/23/22.  Recent Results (from the past 2160 hour(s))  TSH     Status: None   Collection Time: 11/21/22 11:30 AM  Result Value Ref Range   TSH 1.310 0.450 - 4.500 uIU/mL  CMP14+EGFR     Status: Abnormal   Collection Time: 11/21/22 11:30 AM  Result Value Ref Range   Glucose 89 70 - 99 mg/dL   BUN 15 8 - 27 mg/dL   Creatinine, Ser 4.09 (H) 0.57 - 1.00 mg/dL   eGFR 52 (L) >81 XB/JYN/8.29   BUN/Creatinine Ratio 14 12 - 28   Sodium 139 134 - 144 mmol/L   Potassium 4.6 3.5 - 5.2 mmol/L   Chloride 100 96 - 106 mmol/L   CO2 25 20 - 29 mmol/L   Calcium 9.1 8.7 - 10.3 mg/dL   Total Protein 6.5 6.0 - 8.5 g/dL   Albumin 4.1 3.8 - 4.8 g/dL   Globulin, Total 2.4 1.5 - 4.5 g/dL   Bilirubin Total 0.5 0.0 - 1.2 mg/dL   Alkaline Phosphatase 63 44 - 121 IU/L   AST 23 0 - 40 IU/L   ALT 19 0 - 32 IU/L  Lipid Panel w/o Chol/HDL Ratio     Status: None   Collection Time: 11/21/22 11:30 AM  Result Value Ref Range   Cholesterol, Total 150 100 - 199 mg/dL   Triglycerides 81 0 - 149 mg/dL   HDL 80 >56 mg/dL   VLDL Cholesterol  Cal 15 5 - 40 mg/dL   LDL Chol Calc (NIH) 55 0 - 99  mg/dL      Assessment & Plan:  Continue all meds. Problem List Items Addressed This Visit     Essential hypertension, benign - Primary   Atrial fibrillation (HCC)   Cough, persistent   Bronchiectasis without complication (HCC)   Acquired hypothyroidism   Mixed hyperlipidemia    Return in about 4 months (around 03/26/2023).   Total time spent: 30 minutes  Margaretann Loveless, MD  11/23/2022   This document may have been prepared by [facility] Voice Recognition software and as such may include unintentional dictation errors.

## 2022-11-26 ENCOUNTER — Other Ambulatory Visit: Payer: Self-pay | Admitting: Cardiovascular Disease

## 2022-11-26 DIAGNOSIS — I4892 Unspecified atrial flutter: Secondary | ICD-10-CM

## 2022-12-08 IMAGING — US US PELVIS COMPLETE WITH TRANSVAGINAL
1 series · 13 of 25 positions shown · non-contrast
Comparison: None

CLINICAL DATA: Pelvic pain for 6 weeks, postmenopausal

EXAM:
TRANSABDOMINAL AND TRANSVAGINAL ULTRASOUND OF PELVIS
TECHNIQUE: Both transabdominal and transvaginal ultrasound examinations of the
pelvis were performed. Transabdominal technique was performed for
global imaging of the pelvis including uterus, ovaries, adnexal
regions, and pelvic cul-de-sac. It was necessary to proceed with
endovaginal exam following the transabdominal exam to visualize the
endometrium and LEFT ovary.

[Series 1: us pelvic complete with transvaginal · 13 of 92 slices shown]
[im 1/92]
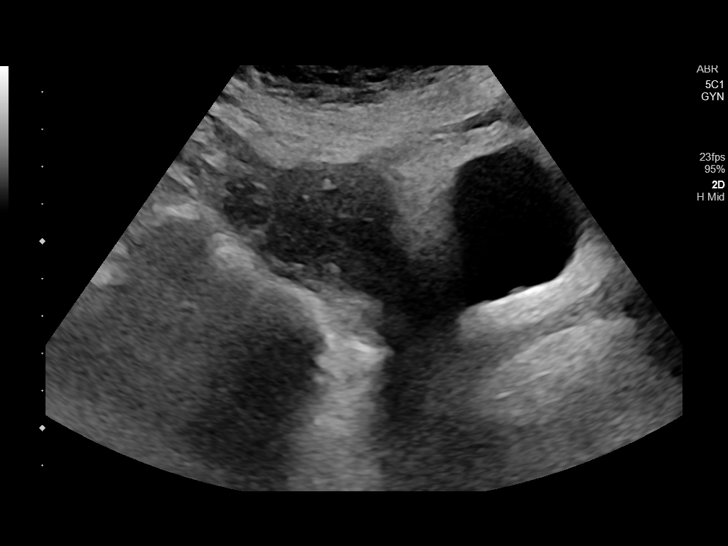
[im 8/92]
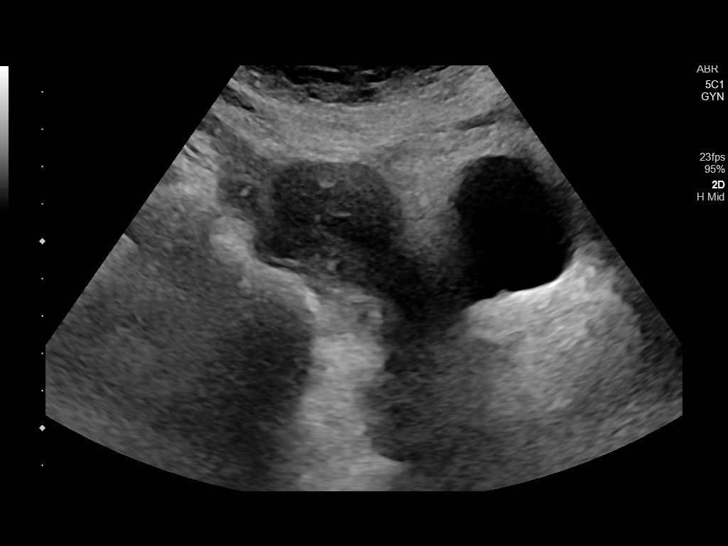
[im 16/92]
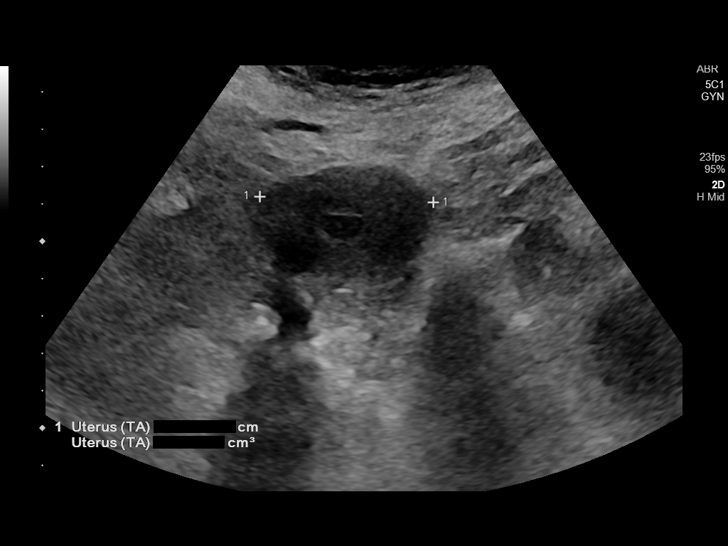
[im 23/92]
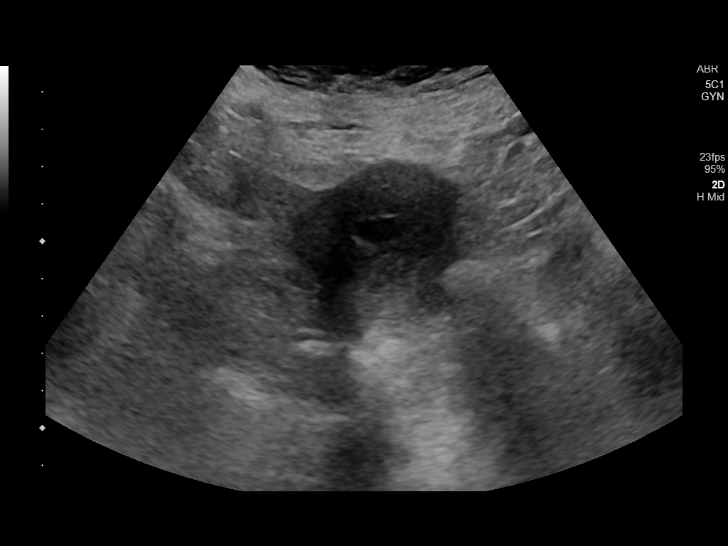
[im 31/92]
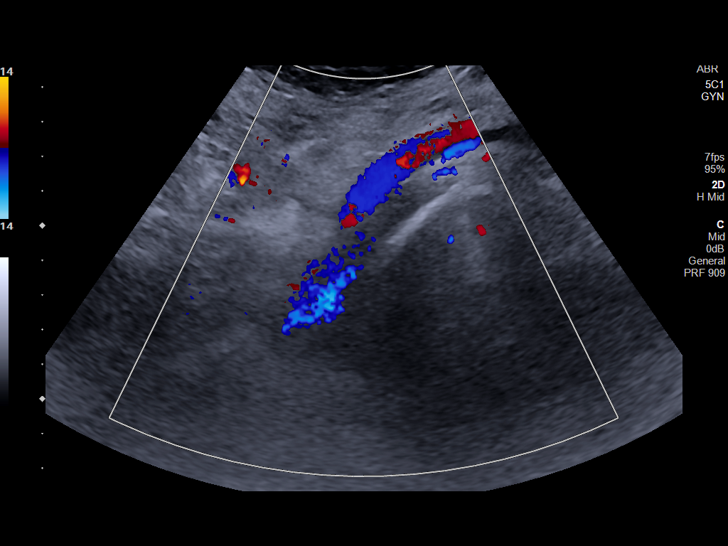
[im 38/92]
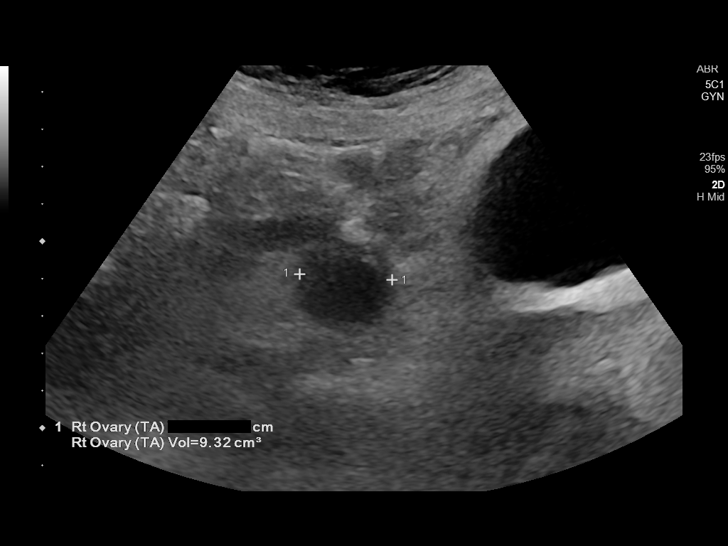
[im 46/92]
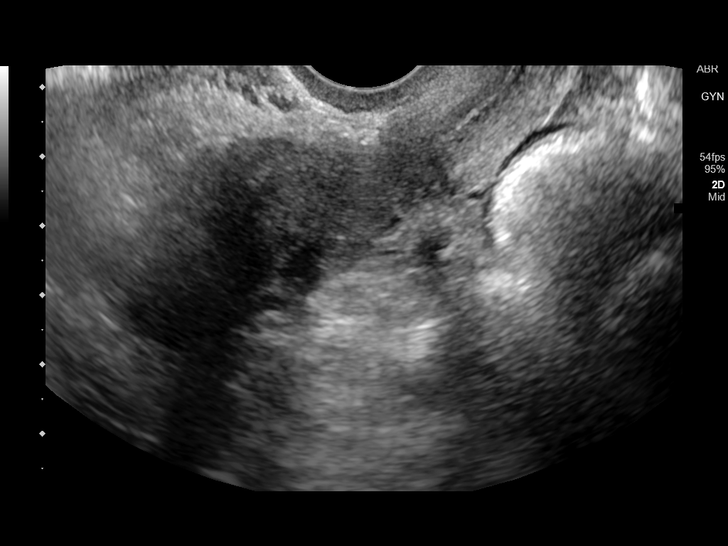
[im 54/92]
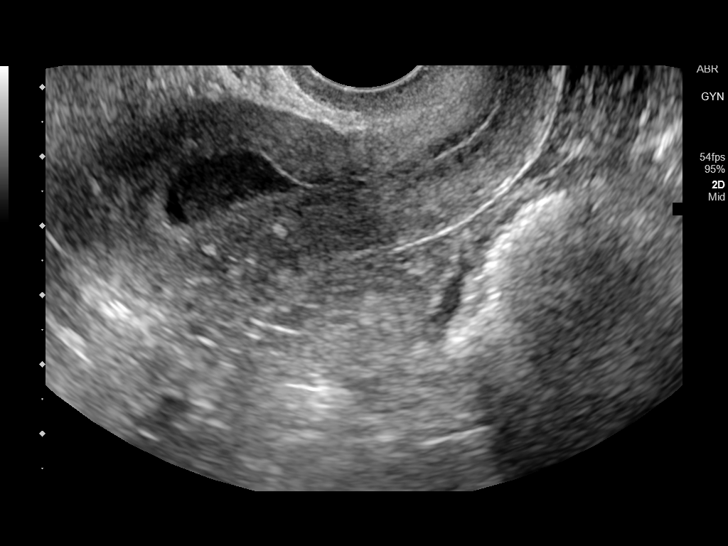
[im 61/92]
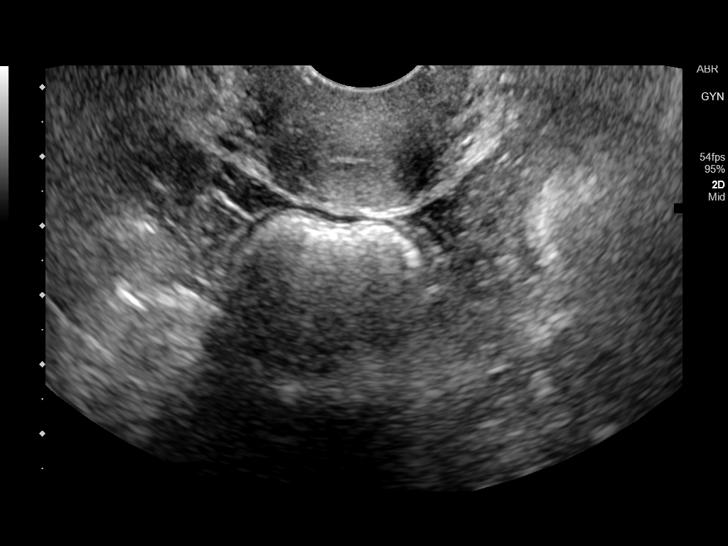
[im 69/92]
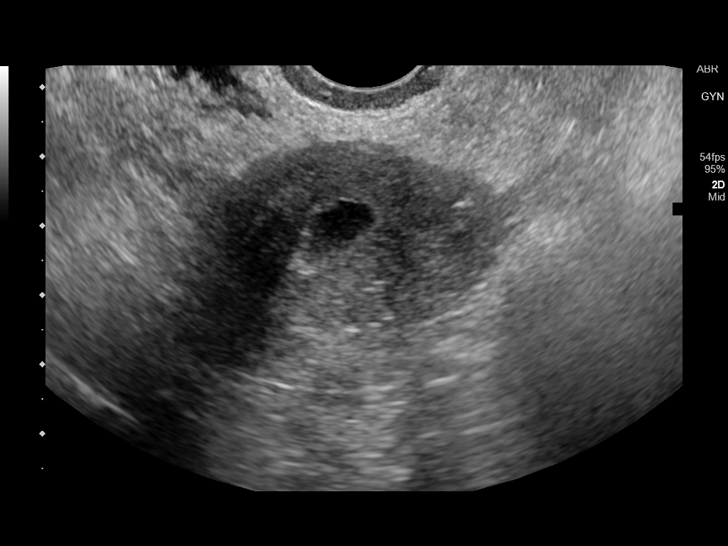
[im 76/92]
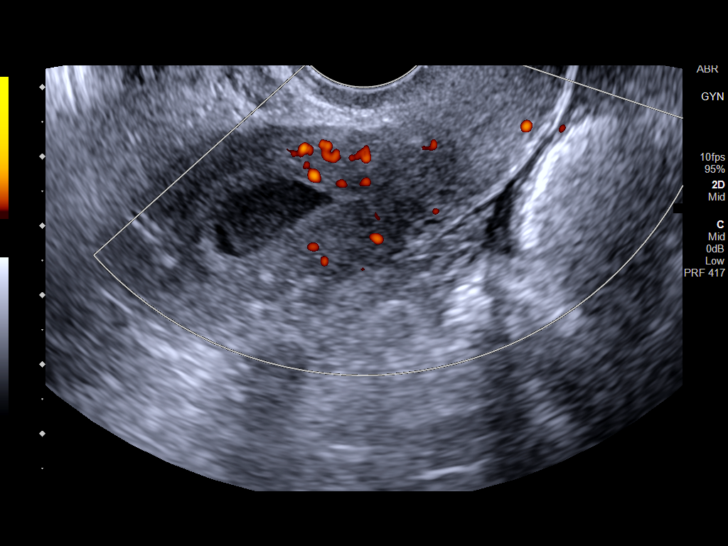
[im 84/92]
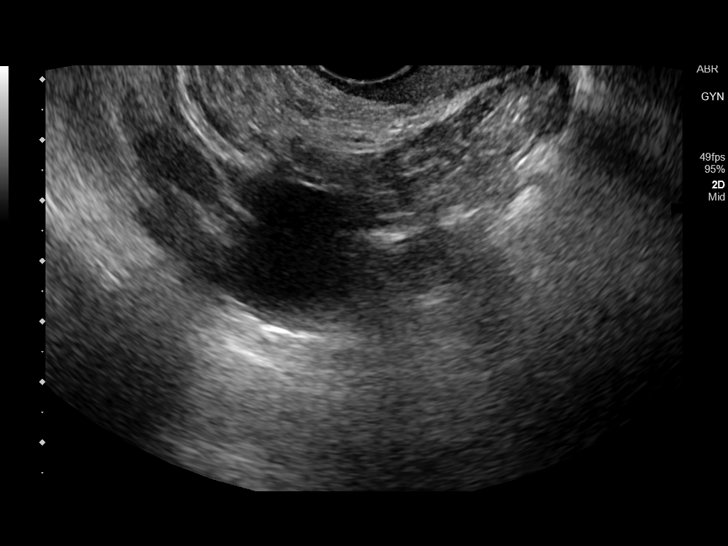
[im 92/92]
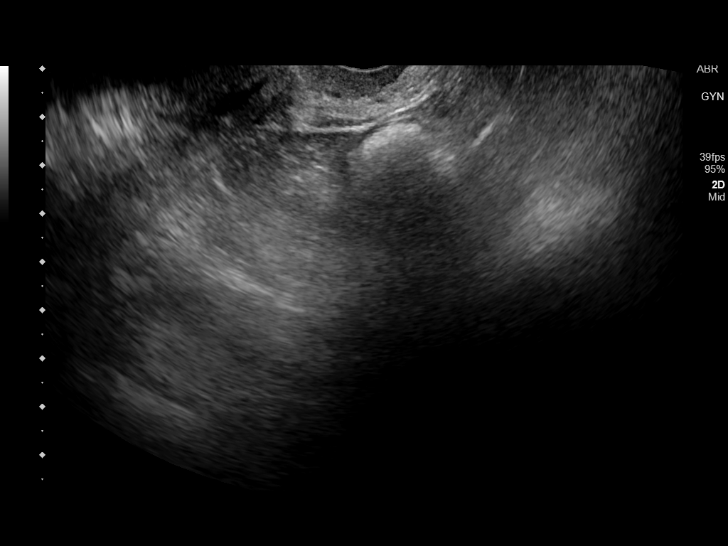

[13 of 25 positions shown; findings below may reference images not displayed]

FINDINGS: Uterus

Measurements: 7.6 x 3.6 x 4.5 cm = volume: 64 mL. Anteverted. Normal
morphology without mass

Endometrium

Thickness: 3 mm. No endometrial mass. Upper uterine segment
endometrial canal distended by complex fluid containing low level
internal echoes, collection measuring 17 x 8 x 11 mm.

Right ovary

Measurements: 2.9 x 2.5 x 2.5 cm = volume: 9.3 mL. Cyst RIGHT ovary
2.1 x 2.0 x 1.7 cm containing scattered artifacts and showing good
through transmission. No septations or mural nodularity; no
follow-up imaging recommended. Reference: Radiology [DATE]):[id number]

Left ovary

Not visualized, likely obscured by bowel

Other findings

No free pelvic fluid or adnexal masses.
IMPRESSION: Nonspecific small complex fluid collection within upper uterine
segment endometrial canal.

Nonvisualization of LEFT ovary.

## 2022-12-11 ENCOUNTER — Other Ambulatory Visit: Payer: Self-pay | Admitting: Cardiovascular Disease

## 2022-12-11 ENCOUNTER — Other Ambulatory Visit: Payer: Self-pay | Admitting: Internal Medicine

## 2022-12-11 DIAGNOSIS — I4892 Unspecified atrial flutter: Secondary | ICD-10-CM

## 2022-12-25 ENCOUNTER — Other Ambulatory Visit: Payer: Self-pay | Admitting: Internal Medicine

## 2022-12-25 ENCOUNTER — Telehealth: Payer: Self-pay | Admitting: Internal Medicine

## 2022-12-25 MED ORDER — MOLNUPIRAVIR 200 MG PO CAPS: 4.0000 | ORAL_CAPSULE | Freq: Two times a day (BID) | ORAL | 0 refills | Status: AC

## 2022-12-25 NOTE — Telephone Encounter (Signed)
Patient left VM that she has a bad case of Covid and would like a Z-pack sent in. She cannot take Paxlovid because the last time that she took it she had a reaction. Please advise.

## 2022-12-26 ENCOUNTER — Telehealth: Payer: Self-pay | Admitting: Internal Medicine

## 2022-12-26 ENCOUNTER — Other Ambulatory Visit: Payer: Self-pay | Admitting: Internal Medicine

## 2022-12-26 MED ORDER — AZITHROMYCIN 250 MG PO TABS: ORAL_TABLET | ORAL | 0 refills | Status: AC

## 2022-12-26 NOTE — Telephone Encounter (Signed)
Patient called and said the molnupiravir was $1100 at the pharmacy. She is now requesting a Z-pack for her lungs. Can we send this in for her?

## 2023-01-04 ENCOUNTER — Other Ambulatory Visit: Payer: Self-pay | Admitting: Internal Medicine

## 2023-01-04 DIAGNOSIS — I480 Paroxysmal atrial fibrillation: Secondary | ICD-10-CM

## 2023-01-17 ENCOUNTER — Other Ambulatory Visit: Payer: Self-pay | Admitting: Family

## 2023-03-24 ENCOUNTER — Other Ambulatory Visit: Payer: Self-pay | Admitting: Cardiovascular Disease

## 2023-03-24 DIAGNOSIS — I480 Paroxysmal atrial fibrillation: Secondary | ICD-10-CM

## 2023-03-26 ENCOUNTER — Ambulatory Visit: Payer: Medicare Other | Admitting: Internal Medicine

## 2023-04-09 ENCOUNTER — Ambulatory Visit: Payer: Medicare Other | Admitting: Internal Medicine

## 2023-04-15 ENCOUNTER — Ambulatory Visit (INDEPENDENT_AMBULATORY_CARE_PROVIDER_SITE_OTHER): Payer: Medicare Other | Admitting: Internal Medicine

## 2023-04-15 ENCOUNTER — Encounter: Payer: Self-pay | Admitting: Internal Medicine

## 2023-04-15 VITALS — BP 138/75 | HR 70 | Ht 68.0 in | Wt 180.4 lb

## 2023-04-15 DIAGNOSIS — R7303 Prediabetes: Secondary | ICD-10-CM

## 2023-04-15 DIAGNOSIS — K219 Gastro-esophageal reflux disease without esophagitis: Secondary | ICD-10-CM

## 2023-04-15 DIAGNOSIS — I482 Chronic atrial fibrillation, unspecified: Secondary | ICD-10-CM | POA: Diagnosis not present

## 2023-04-15 DIAGNOSIS — E782 Mixed hyperlipidemia: Secondary | ICD-10-CM

## 2023-04-15 DIAGNOSIS — I10 Essential (primary) hypertension: Secondary | ICD-10-CM

## 2023-04-15 DIAGNOSIS — R7309 Other abnormal glucose: Secondary | ICD-10-CM

## 2023-04-15 DIAGNOSIS — E039 Hypothyroidism, unspecified: Secondary | ICD-10-CM

## 2023-04-15 MED ORDER — LIRAGLUTIDE 18 MG/3ML ~~LOC~~ SOPN: PEN_INJECTOR | SUBCUTANEOUS | 1 refills | Status: DC

## 2023-04-15 MED ORDER — LIRAGLUTIDE 18 MG/3ML ~~LOC~~ SOPN: 0.6000 mg | PEN_INJECTOR | SUBCUTANEOUS | 0 refills | Status: DC

## 2023-04-16 ENCOUNTER — Encounter: Payer: Self-pay | Admitting: Internal Medicine

## 2023-04-16 NOTE — Progress Notes (Signed)
Established Patient Office Visit  Subjective:  Patient ID: Kathy Ashley, female    DOB: 1945-08-07  Age: 77 y.o. MRN: [id number]  Chief Complaint  Patient presents with   Follow-up    4 month    Patient comes in for her follow-up today.  She is actually feeling well and has no new complaints.  However she is concerned about her weight.  She has history of prediabetes.  Would like to try Victoza injections. She is a bit rushed today, will return for annual wellness visit.  Will check labs at that time.    No other concerns at this time.   Past Medical History:  Diagnosis Date   Allergy    Asthma    Atrial fibrillation (HCC)    Collapsed lung 2016   COPD (chronic obstructive pulmonary disease) (HCC)    Hepatitis A 2016   Hypertension 2005   Pneumonia 2016   Seasonal allergies    Urinary incontinence     Past Surgical History:  Procedure Laterality Date   BREAST BIOPSY Right 2002   neg   CARDIOVERSION  02/09/2016   Due to A-fib/ Dr Burman Riis   CESAREAN SECTION     COLONOSCOPY  [phone]   DILATION AND CURETTAGE OF UTERUS     ELECTROPHYSIOLOGIC STUDY N/A 02/09/2016   Procedure: CARDIOVERSION;  Surgeon: Laurier Nancy, MD;  Location: [facility];  Service: Cardiovascular;  Laterality: N/A;   INCISION AND DRAINAGE ABSCESS Right 07/21/2016   Procedure: INCISION AND DRAINAGE ABSCESS Right Lower Leg;  Surgeon: Henrene Dodge, MD;  Location: [facility];  Service: General;  Laterality: Right;   TONSILLECTOMY      Social History   Socioeconomic History   Marital status: Divorced    Spouse name: Not on file   Number of children: Not on file   Years of education: Not on file   Highest education level: Not on file  Occupational History   Occupation: Retired  Tobacco Use   Smoking status: Never   Smokeless tobacco: Never  Substance and Sexual Activity   Alcohol use: Yes    Comment: wine occasionally   Drug use: No   Sexual activity: Not on file  Other Topics  Concern   Not on file  Social History Narrative   Not on file   Social Determinants of Health   Financial Resource Strain: Not on file  Food Insecurity: Not on file  Transportation Needs: Not on file  Physical Activity: Not on file  Stress: Not on file  Social Connections: Not on file  Intimate Partner Violence: Not on file    Family History  Problem Relation Age of Onset   Diabetes Father    Heart disease Mother    Hypertension Mother    Stroke Mother    Breast cancer Neg Hx     Allergies  Allergen Reactions   Penicillins Hives    Outpatient Medications Prior to Visit  Medication Sig   amiodarone (PACERONE) 200 MG tablet TAKE 1 TABLET BY MOUTH EVERY DAY   ELIQUIS 5 MG TABS tablet TAKE 1 TABLET BY MOUTH TWICE A DAY   furosemide (LASIX) 20 MG tablet TAKE 1 TABLET BY MOUTH EVERY DAY   gabapentin (NEURONTIN) 300 MG capsule TAKE 1 CAPSULE BY MOUTH EVERYDAY AT BEDTIME   levothyroxine (SYNTHROID) 88 MCG tablet TAKE 1 TABLET BY MOUTH EVERY DAY ON AN EMPTY STOMACH   losartan (COZAAR) 50 MG tablet TAKE 1 TABLET BY MOUTH EVERY DAY  metoprolol succinate (TOPROL-XL) 25 MG 24 hr tablet TAKE 1 TABLET BY MOUTH EVERY DAY   rosuvastatin (CRESTOR) 20 MG tablet Take 20 mg by mouth daily.   traZODone (DESYREL) 150 MG tablet TAKE 1 TABLET BY MOUTH EVERYDAY AT BEDTIME   venlafaxine XR (EFFEXOR-XR) 75 MG 24 hr capsule Take 75 mg by mouth daily with breakfast.   fluticasone furoate-vilanterol (BREO ELLIPTA) 100-25 MCG/INH AEPB Inhale into the lungs. (Patient not taking: Reported on 11/23/2022)   montelukast (SINGULAIR) 10 MG tablet Take 10 mg by mouth daily. (Patient not taking: Reported on 11/23/2022)   No facility-administered medications prior to visit.    Review of Systems  Constitutional: Negative.  Negative for chills, fever, malaise/fatigue and weight loss.  HENT: Negative.  Negative for congestion, hearing loss and sinus pain.   Eyes: Negative.   Respiratory: Negative.  Negative  for cough, shortness of breath, wheezing and stridor.   Cardiovascular: Negative.  Negative for chest pain, palpitations and leg swelling.  Gastrointestinal: Negative.  Negative for abdominal pain, constipation, diarrhea, heartburn, nausea and vomiting.  Genitourinary: Negative.  Negative for dysuria and flank pain.  Musculoskeletal: Negative.  Negative for joint pain and myalgias.  Skin: Negative.   Neurological: Negative.  Negative for dizziness, tingling, tremors, sensory change and headaches.  Endo/Heme/Allergies: Negative.   Psychiatric/Behavioral: Negative.  Negative for depression and suicidal ideas. The patient is not nervous/anxious.        Objective:   BP 138/75   Pulse 70   Ht 5\' 8"  (1.727 m)   Wt 180 lb 6.4 oz (81.8 kg)   SpO2 97%   BMI 27.43 kg/m   Vitals:   04/15/23 1354  BP: 138/75  Pulse: 70  Height: 5\' 8"  (1.727 m)  Weight: 180 lb 6.4 oz (81.8 kg)  SpO2: 97%  BMI (Calculated): 27.44    Physical Exam Vitals and nursing note reviewed.  Constitutional:      Appearance: Normal appearance.  HENT:     Head: Normocephalic and atraumatic.     Nose: Nose normal.     Mouth/Throat:     Mouth: Mucous membranes are moist.     Pharynx: Oropharynx is clear.  Eyes:     Conjunctiva/sclera: Conjunctivae normal.     Pupils: Pupils are equal, round, and reactive to light.  Cardiovascular:     Rate and Rhythm: Normal rate and regular rhythm.     Pulses: Normal pulses.     Heart sounds: Normal heart sounds. No murmur heard. Pulmonary:     Effort: Pulmonary effort is normal.     Breath sounds: Normal breath sounds. No wheezing.  Abdominal:     General: Bowel sounds are normal.     Palpations: Abdomen is soft.     Tenderness: There is no abdominal tenderness. There is no right CVA tenderness or left CVA tenderness.  Musculoskeletal:        General: Normal range of motion.     Cervical back: Normal range of motion.     Right lower leg: No edema.     Left lower  leg: No edema.  Skin:    General: Skin is warm and dry.  Neurological:     General: No focal deficit present.     Mental Status: She is alert and oriented to person, place, and time.  Psychiatric:        Mood and Affect: Mood normal.        Behavior: Behavior normal.      No results  found for any visits on 04/15/23.  No results found for this or any previous visit (from the past 2160 hour(s)).    Assessment & Plan:  New current medications.  Schedule AWV.Marland Kitchen Problem List Items Addressed This Visit     GERD (gastroesophageal reflux disease)   Essential hypertension, benign - Primary   Atrial fibrillation (HCC)   Acquired hypothyroidism   Mixed hyperlipidemia   Other Visit Diagnoses     Prediabetes       Elevated glucose level       Relevant Medications   liraglutide (VICTOZA) 18 MG/3ML SOPN       Follow up one week.   Total time spent: 25 minutes  Margaretann Loveless, MD  04/15/2023   This document may have been prepared by [facility] Voice Recognition software and as such may include unintentional dictation errors.

## 2023-04-18 ENCOUNTER — Ambulatory Visit: Payer: Medicare Other | Admitting: Cardiology

## 2023-04-22 ENCOUNTER — Encounter: Payer: Self-pay | Admitting: Cardiology

## 2023-04-22 ENCOUNTER — Other Ambulatory Visit: Payer: Self-pay | Admitting: Internal Medicine

## 2023-04-22 ENCOUNTER — Ambulatory Visit: Payer: Medicare Other | Admitting: Cardiology

## 2023-04-22 ENCOUNTER — Ambulatory Visit (INDEPENDENT_AMBULATORY_CARE_PROVIDER_SITE_OTHER): Payer: Medicare Other | Admitting: Cardiology

## 2023-04-22 VITALS — BP 112/28 | HR 80 | Ht 68.0 in | Wt 174.8 lb

## 2023-04-22 DIAGNOSIS — Z0000 Encounter for general adult medical examination without abnormal findings: Secondary | ICD-10-CM | POA: Diagnosis not present

## 2023-04-22 DIAGNOSIS — I10 Essential (primary) hypertension: Secondary | ICD-10-CM | POA: Diagnosis not present

## 2023-04-22 DIAGNOSIS — E039 Hypothyroidism, unspecified: Secondary | ICD-10-CM

## 2023-04-22 DIAGNOSIS — E782 Mixed hyperlipidemia: Secondary | ICD-10-CM

## 2023-04-22 DIAGNOSIS — R7303 Prediabetes: Secondary | ICD-10-CM

## 2023-04-22 DIAGNOSIS — I482 Chronic atrial fibrillation, unspecified: Secondary | ICD-10-CM

## 2023-04-22 NOTE — Progress Notes (Addendum)
Established Patient Office Visit  Subjective:  Patient ID: Kathy Ashley, female    DOB: [birth year]  Age: 77 y.o. MRN: [id number]  Chief Complaint  Patient presents with   Annual Exam    AWV    Patient in office for annual medicare wellness visit. Patient doing well, no complaints today.     No other concerns at this time.   Past Medical History:  Diagnosis Date   Allergy    Asthma    Atrial fibrillation (HCC)    Collapsed lung 2016   COPD (chronic obstructive pulmonary disease) (HCC)    Hepatitis A 2016   Hypertension 2005   Pneumonia 2016   Seasonal allergies    Urinary incontinence     Past Surgical History:  Procedure Laterality Date   BREAST BIOPSY Right 2002   neg   CARDIOVERSION  02/09/2016   Due to A-fib/ Dr Burman Riis   CESAREAN SECTION     COLONOSCOPY  [phone]   DILATION AND CURETTAGE OF UTERUS     ELECTROPHYSIOLOGIC STUDY N/A 02/09/2016   Procedure: CARDIOVERSION;  Surgeon: Laurier Nancy, MD;  Location: [facility];  Service: Cardiovascular;  Laterality: N/A;   INCISION AND DRAINAGE ABSCESS Right 07/21/2016   Procedure: INCISION AND DRAINAGE ABSCESS Right Lower Leg;  Surgeon: Henrene Dodge, MD;  Location: [facility];  Service: General;  Laterality: Right;   TONSILLECTOMY      Social History   Socioeconomic History   Marital status: Divorced    Spouse name: Not on file   Number of children: Not on file   Years of education: Not on file   Highest education level: Not on file  Occupational History   Occupation: Retired  Tobacco Use   Smoking status: Never   Smokeless tobacco: Never  Substance and Sexual Activity   Alcohol use: Yes    Comment: wine occasionally   Drug use: No   Sexual activity: Not on file  Other Topics Concern   Not on file  Social History Narrative   Not on file   Social Drivers of Health   Financial Resource Strain: Low Risk  (04/22/2023)   Overall Financial Resource Strain (CARDIA)    Difficulty of Paying  Living Expenses: Not hard at all  Food Insecurity: No Food Insecurity (04/22/2023)   Hunger Vital Sign    Worried About Running Out of Food in the Last Year: Never true    Ran Out of Food in the Last Year: Never true  Transportation Needs: No Transportation Needs (04/22/2023)   PRAPARE - Administrator, Civil Service (Medical): No    Lack of Transportation (Non-Medical): No  Physical Activity: Insufficiently Active (04/22/2023)   Exercise Vital Sign    Days of Exercise per Week: 3 days    Minutes of Exercise per Session: 20 min  Stress: No Stress Concern Present (04/22/2023)   [employer] of Occupational Health - Occupational Stress Questionnaire    Feeling of Stress : Not at all  Social Connections: Not on file  Intimate Partner Violence: Not At Risk (04/22/2023)   Humiliation, Afraid, Rape, and Kick questionnaire    Fear of Current or Ex-Partner: No    Emotionally Abused: No    Physically Abused: No    Sexually Abused: No    Family History  Problem Relation Age of Onset   Diabetes Father    Heart disease Mother    Hypertension Mother    Stroke Mother    Breast  cancer Neg Hx     Allergies  Allergen Reactions   Penicillins Hives    Outpatient Medications Prior to Visit  Medication Sig   amiodarone (PACERONE) 200 MG tablet TAKE 1 TABLET BY MOUTH EVERY DAY   ELIQUIS 5 MG TABS tablet TAKE 1 TABLET BY MOUTH TWICE A DAY   furosemide (LASIX) 20 MG tablet TAKE 1 TABLET BY MOUTH EVERY DAY   gabapentin (NEURONTIN) 300 MG capsule TAKE 1 CAPSULE BY MOUTH EVERYDAY AT BEDTIME   levothyroxine (SYNTHROID) 88 MCG tablet TAKE 1 TABLET BY MOUTH EVERY DAY ON AN EMPTY STOMACH   liraglutide (VICTOZA) 18 MG/3ML SOPN 0.6 mg [place] injection daily  for 1 week- Then increase to 1.2 mg [place] daily.   losartan (COZAAR) 50 MG tablet TAKE 1 TABLET BY MOUTH EVERY DAY   metoprolol succinate (TOPROL-XL) 25 MG 24 hr tablet TAKE 1 TABLET BY MOUTH EVERY DAY   montelukast (SINGULAIR) 10 MG  tablet Take 10 mg by mouth daily.   rosuvastatin (CRESTOR) 20 MG tablet Take 20 mg by mouth daily.   traZODone (DESYREL) 150 MG tablet TAKE 1 TABLET BY MOUTH EVERYDAY AT BEDTIME   venlafaxine XR (EFFEXOR-XR) 75 MG 24 hr capsule Take 75 mg by mouth daily with breakfast.   fluticasone furoate-vilanterol (BREO ELLIPTA) 100-25 MCG/INH AEPB Inhale into the lungs. (Patient not taking: Reported on 04/22/2023)   No facility-administered medications prior to visit.    Review of Systems  Constitutional: Negative.   HENT: Negative.    Eyes: Negative.   Respiratory: Negative.  Negative for shortness of breath.   Cardiovascular: Negative.  Negative for chest pain.  Gastrointestinal: Negative.  Negative for abdominal pain, constipation and diarrhea.  Genitourinary: Negative.   Musculoskeletal:  Negative for joint pain and myalgias.  Skin: Negative.   Neurological: Negative.  Negative for dizziness and headaches.  Endo/Heme/Allergies: Negative.   All other systems reviewed and are negative.      Objective:   BP (!) 112/28   Pulse 80   Ht 5\' 8"  (1.727 m)   Wt 174 lb 12.8 oz (79.3 kg)   SpO2 98%   BMI 26.58 kg/m   Vitals:   04/22/23 1123  BP: (!) 112/28  Pulse: 80  Height: 5\' 8"  (1.727 m)  Weight: 174 lb 12.8 oz (79.3 kg)  SpO2: 98%  BMI (Calculated): 26.58    Physical Exam Vitals and nursing note reviewed.  Constitutional:      Appearance: Normal appearance. She is normal weight.  HENT:     Head: Normocephalic and atraumatic.     Nose: Nose normal.     Mouth/Throat:     Mouth: Mucous membranes are moist.  Eyes:     Extraocular Movements: Extraocular movements intact.     Conjunctiva/sclera: Conjunctivae normal.     Pupils: Pupils are equal, round, and reactive to light.  Cardiovascular:     Rate and Rhythm: Normal rate and regular rhythm.     Pulses: Normal pulses.     Heart sounds: Normal heart sounds.  Pulmonary:     Effort: Pulmonary effort is normal.     Breath  sounds: Normal breath sounds.  Abdominal:     General: Abdomen is flat. Bowel sounds are normal.     Palpations: Abdomen is soft.  Musculoskeletal:        General: Normal range of motion.     Cervical back: Normal range of motion.  Skin:    General: Skin is warm and dry.  Neurological:  General: No focal deficit present.     Mental Status: She is alert and oriented to person, place, and time.  Psychiatric:        Mood and Affect: Mood normal.        Behavior: Behavior normal.        Thought Content: Thought content normal.        Judgment: Judgment normal.      No results found for any visits on 04/22/23.  No results found for this or any previous visit (from the past 2160 hours).    Assessment & Plan:  Continue same medications. Fasting lab work today.   Problem List Items Addressed This Visit       Cardiovascular and Mediastinum   Essential hypertension, benign   Relevant Orders   CMP14+EGFR     Endocrine   Acquired hypothyroidism   Relevant Orders   TSH     Other   Mixed hyperlipidemia   Relevant Orders   Lipid Profile   Encounter for annual health examination - Primary    Return in about 3 months (around 07/21/2023) for with NK.   Total time spent: 25 minutes  [employer], NP  04/22/2023   This document may have been prepared by Dragon Voice Recognition software and as such may include unintentional dictation errors.

## 2023-04-23 LAB — TSH+T4F+T3FREE
Free T4: 1.69 ng/dL (ref 0.82–1.77)
T3, Free: 1.9 pg/mL — ABNORMAL LOW (ref 2.0–4.4)
TSH: 3.29 u[IU]/mL (ref 0.450–4.500)

## 2023-04-23 LAB — CMP14+EGFR
ALT: 18 [IU]/L (ref 0–32)
AST: 26 [IU]/L (ref 0–40)
Albumin: 4.1 g/dL (ref 3.8–4.8)
Alkaline Phosphatase: 69 [IU]/L (ref 44–121)
BUN/Creatinine Ratio: 18 (ref 12–28)
BUN: 21 mg/dL (ref 8–27)
Bilirubin Total: 0.5 mg/dL (ref 0.0–1.2)
CO2: 23 mmol/L (ref 20–29)
Calcium: 8.9 mg/dL (ref 8.7–10.3)
Chloride: 98 mmol/L (ref 96–106)
Creatinine, Ser: 1.16 mg/dL — ABNORMAL HIGH (ref 0.57–1.00)
Globulin, Total: 2.7 g/dL (ref 1.5–4.5)
Glucose: 84 mg/dL (ref 70–99)
Potassium: 4.6 mmol/L (ref 3.5–5.2)
Sodium: 136 mmol/L (ref 134–144)
Total Protein: 6.8 g/dL (ref 6.0–8.5)
eGFR: 49 mL/min/{1.73_m2} — ABNORMAL LOW (ref >59)

## 2023-04-23 LAB — HEMOGLOBIN A1C
Est. average glucose Bld gHb Est-mCnc: 108 mg/dL
Hgb A1c MFr Bld: 5.4 % (ref 4.8–5.6)

## 2023-04-23 LAB — LIPID PANEL W/O CHOL/HDL RATIO
Cholesterol, Total: 184 mg/dL (ref 100–199)
HDL: 97 mg/dL (ref >39)
LDL Chol Calc (NIH): 75 mg/dL (ref 0–99)
Triglycerides: 64 mg/dL (ref 0–149)
VLDL Cholesterol Cal: 12 mg/dL (ref 5–40)

## 2023-04-24 ENCOUNTER — Telehealth: Payer: Self-pay | Admitting: Internal Medicine

## 2023-04-24 ENCOUNTER — Encounter: Payer: Self-pay | Admitting: Internal Medicine

## 2023-04-24 NOTE — Telephone Encounter (Signed)
Patient left VM that she is very concerned about her lab results, mainly her kidney function. I see you said labs stable and continue meds. Is the kidney function decreasing anything she should be worried about?

## 2023-04-25 ENCOUNTER — Other Ambulatory Visit: Payer: Self-pay | Admitting: Cardiovascular Disease

## 2023-05-24 ENCOUNTER — Other Ambulatory Visit: Payer: Self-pay | Admitting: Internal Medicine

## 2023-05-24 DIAGNOSIS — E785 Hyperlipidemia, unspecified: Secondary | ICD-10-CM

## 2023-05-25 ENCOUNTER — Other Ambulatory Visit: Payer: Self-pay | Admitting: Cardiovascular Disease

## 2023-05-25 DIAGNOSIS — I4892 Unspecified atrial flutter: Secondary | ICD-10-CM

## 2023-05-30 ENCOUNTER — Other Ambulatory Visit: Payer: Self-pay | Admitting: Cardiovascular Disease

## 2023-05-30 ENCOUNTER — Other Ambulatory Visit: Payer: Self-pay | Admitting: Internal Medicine

## 2023-05-30 DIAGNOSIS — I480 Paroxysmal atrial fibrillation: Secondary | ICD-10-CM

## 2023-06-06 ENCOUNTER — Other Ambulatory Visit: Payer: Self-pay | Admitting: Internal Medicine

## 2023-06-06 DIAGNOSIS — E785 Hyperlipidemia, unspecified: Secondary | ICD-10-CM

## 2023-06-06 MED ORDER — LEVOTHYROXINE SODIUM 88 MCG PO TABS: 88.0000 ug | ORAL_TABLET | Freq: Every day | ORAL | 3 refills | Status: DC

## 2023-06-15 ENCOUNTER — Other Ambulatory Visit: Payer: Self-pay | Admitting: Internal Medicine

## 2023-06-15 DIAGNOSIS — I480 Paroxysmal atrial fibrillation: Secondary | ICD-10-CM

## 2023-06-20 ENCOUNTER — Other Ambulatory Visit: Payer: Self-pay | Admitting: Internal Medicine

## 2023-06-25 ENCOUNTER — Other Ambulatory Visit: Payer: Self-pay | Admitting: Internal Medicine

## 2023-06-25 DIAGNOSIS — R7309 Other abnormal glucose: Secondary | ICD-10-CM

## 2023-06-28 ENCOUNTER — Other Ambulatory Visit: Payer: Self-pay | Admitting: Internal Medicine

## 2023-06-28 DIAGNOSIS — E785 Hyperlipidemia, unspecified: Secondary | ICD-10-CM

## 2023-06-28 MED ORDER — ROSUVASTATIN CALCIUM 20 MG PO TABS: 20.0000 mg | ORAL_TABLET | Freq: Every day | ORAL | 3 refills | Status: AC

## 2023-06-28 MED ORDER — LOSARTAN POTASSIUM 50 MG PO TABS: 50.0000 mg | ORAL_TABLET | Freq: Every day | ORAL | 3 refills | Status: DC

## 2023-07-16 ENCOUNTER — Other Ambulatory Visit: Payer: Self-pay | Admitting: Internal Medicine

## 2023-07-16 DIAGNOSIS — R7309 Other abnormal glucose: Secondary | ICD-10-CM

## 2023-07-16 MED ORDER — LIRAGLUTIDE 18 MG/3ML ~~LOC~~ SOPN: PEN_INJECTOR | SUBCUTANEOUS | 1 refills | Status: DC

## 2023-08-16 ENCOUNTER — Encounter: Payer: Self-pay | Admitting: Internal Medicine

## 2023-08-26 ENCOUNTER — Other Ambulatory Visit: Payer: Self-pay | Admitting: Internal Medicine

## 2023-08-30 ENCOUNTER — Other Ambulatory Visit: Payer: Self-pay

## 2023-08-30 DIAGNOSIS — I4892 Unspecified atrial flutter: Secondary | ICD-10-CM

## 2023-08-30 MED ORDER — METOPROLOL SUCCINATE ER 25 MG PO TB24: 25.0000 mg | ORAL_TABLET | Freq: Every day | ORAL | 3 refills | Status: AC

## 2023-09-06 ENCOUNTER — Other Ambulatory Visit: Payer: Self-pay

## 2023-09-06 MED ORDER — GABAPENTIN 300 MG PO CAPS: 300.0000 mg | ORAL_CAPSULE | Freq: Every day | ORAL | 3 refills | Status: DC

## 2023-10-02 ENCOUNTER — Other Ambulatory Visit: Payer: Self-pay | Admitting: Cardiovascular Disease

## 2023-10-02 DIAGNOSIS — I480 Paroxysmal atrial fibrillation: Secondary | ICD-10-CM

## 2023-10-15 ENCOUNTER — Ambulatory Visit (INDEPENDENT_AMBULATORY_CARE_PROVIDER_SITE_OTHER): Admitting: Internal Medicine

## 2023-10-15 ENCOUNTER — Encounter: Payer: Self-pay | Admitting: Internal Medicine

## 2023-10-15 VITALS — BP 128/74 | HR 66 | Temp 97.7°F | Ht 68.0 in | Wt 170.2 lb

## 2023-10-15 DIAGNOSIS — J209 Acute bronchitis, unspecified: Secondary | ICD-10-CM | POA: Diagnosis not present

## 2023-10-15 DIAGNOSIS — I10 Essential (primary) hypertension: Secondary | ICD-10-CM | POA: Diagnosis not present

## 2023-10-15 DIAGNOSIS — I482 Chronic atrial fibrillation, unspecified: Secondary | ICD-10-CM

## 2023-10-15 DIAGNOSIS — K219 Gastro-esophageal reflux disease without esophagitis: Secondary | ICD-10-CM

## 2023-10-15 DIAGNOSIS — E782 Mixed hyperlipidemia: Secondary | ICD-10-CM | POA: Diagnosis not present

## 2023-10-15 DIAGNOSIS — E039 Hypothyroidism, unspecified: Secondary | ICD-10-CM

## 2023-10-15 DIAGNOSIS — R7303 Prediabetes: Secondary | ICD-10-CM | POA: Diagnosis not present

## 2023-10-15 DIAGNOSIS — J479 Bronchiectasis, uncomplicated: Secondary | ICD-10-CM

## 2023-10-15 MED ORDER — METHYLPREDNISOLONE 4 MG PO TBPK: ORAL_TABLET | ORAL | 0 refills | Status: DC

## 2023-10-15 MED ORDER — LEVOFLOXACIN 500 MG PO TABS: 500.0000 mg | ORAL_TABLET | Freq: Every day | ORAL | 0 refills | Status: AC

## 2023-10-15 NOTE — Progress Notes (Signed)
 Established Patient Office Visit  Subjective:  Patient ID: Kathy Ashley, female    DOB: 1945-08-25  Age: 78 y.o. MRN: [id number]  Chief Complaint  Patient presents with   Nasal Congestion    Patient comes in for her follow-up but is also complaining of chest and sinus congestion.  She is coughing up yellow-green material as well as green nasal discharge.  Denies pleuritic chest pain.  There is no fever but has mild chills.  Her daughter was recently diagnosed with thyroid cancer, patient will check on her biopsy report.  Patient is currently on Victoza  injections. Denies any nausea vomiting or diarrhea.  No chest pain and no shortness of breath.    No other concerns at this time.   Past Medical History:  Diagnosis Date   Allergy    Asthma    Atrial fibrillation (HCC)    Collapsed lung 2016   COPD (chronic obstructive pulmonary disease) (HCC)    Hepatitis A 2016   Hypertension 2005   Pneumonia 2016   Seasonal allergies    Urinary incontinence     Past Surgical History:  Procedure Laterality Date   BREAST BIOPSY Right 2002   neg   CARDIOVERSION  02/09/2016   Due to A-fib/ Dr Hezekiah Louis   CESAREAN SECTION     COLONOSCOPY  [phone]   DILATION AND CURETTAGE OF UTERUS     ELECTROPHYSIOLOGIC STUDY N/A 02/09/2016   Procedure: CARDIOVERSION;  Surgeon: Cherrie Cornwall, MD;  Location: [facility];  Service: Cardiovascular;  Laterality: N/A;   INCISION AND DRAINAGE ABSCESS Right 07/21/2016   Procedure: INCISION AND DRAINAGE ABSCESS Right Lower Leg;  Surgeon: Emmalene Hare, MD;  Location: [facility];  Service: General;  Laterality: Right;   TONSILLECTOMY      Social History   Socioeconomic History   Marital status: Divorced    Spouse name: Not on file   Number of children: Not on file   Years of education: Not on file   Highest education level: Not on file  Occupational History   Occupation: Retired  Tobacco Use   Smoking status: Never   Smokeless tobacco: Never   Substance and Sexual Activity   Alcohol use: Yes    Comment: wine occasionally   Drug use: No   Sexual activity: Not on file  Other Topics Concern   Not on file  Social History Narrative   Not on file   Social Drivers of Health   Financial Resource Strain: Low Risk  (04/22/2023)   Overall Financial Resource Strain (CARDIA)    Difficulty of Paying Living Expenses: Not hard at all  Food Insecurity: No Food Insecurity (04/22/2023)   Hunger Vital Sign    Worried About Running Out of Food in the Last Year: Never true    Ran Out of Food in the Last Year: Never true  Transportation Needs: No Transportation Needs (04/22/2023)   PRAPARE - Administrator, Civil Service (Medical): No    Lack of Transportation (Non-Medical): No  Physical Activity: Insufficiently Active (04/22/2023)   Exercise Vital Sign    Days of Exercise per Week: 3 days    Minutes of Exercise per Session: 20 min  Stress: No Stress Concern Present (04/22/2023)   [employer] of Occupational Health - Occupational Stress Questionnaire    Feeling of Stress : Not at all  Social Connections: Not on file  Intimate Partner Violence: Not At Risk (04/22/2023)   Humiliation, Afraid, Rape, and Kick questionnaire  Fear of Current or Ex-Partner: No    Emotionally Abused: No    Physically Abused: No    Sexually Abused: No    Family History  Problem Relation Age of Onset   Diabetes Father    Heart disease Mother    Hypertension Mother    Stroke Mother    Breast cancer Neg Hx     Allergies  Allergen Reactions   Penicillins Hives    Outpatient Medications Prior to Visit  Medication Sig   amiodarone  (PACERONE ) 200 MG tablet TAKE 1 TABLET BY MOUTH EVERY DAY   ELIQUIS 5 MG TABS tablet TAKE 1 TABLET BY MOUTH TWICE A DAY   furosemide  (LASIX ) 20 MG tablet TAKE 1 TABLET BY MOUTH EVERY DAY   gabapentin  (NEURONTIN ) 300 MG capsule Take 1 capsule (300 mg total) by mouth at bedtime.   levothyroxine  (SYNTHROID )  88 MCG tablet Take 1 tablet (88 mcg total) by mouth daily before breakfast.   losartan  (COZAAR ) 50 MG tablet Take 1 tablet (50 mg total) by mouth daily.   metoprolol  succinate (TOPROL -XL) 25 MG 24 hr tablet Take 1 tablet (25 mg total) by mouth daily.   rosuvastatin  (CRESTOR ) 20 MG tablet Take 1 tablet (20 mg total) by mouth daily.   traZODone  (DESYREL ) 150 MG tablet TAKE 1 TABLET BY MOUTH EVERYDAY AT BEDTIME   venlafaxine  XR (EFFEXOR -XR) 75 MG 24 hr capsule TAKE 1 CAPSULE BY MOUTH EVERY DAY   fluticasone furoate-vilanterol (BREO ELLIPTA) 100-25 MCG/INH AEPB Inhale into the lungs. (Patient not taking: Reported on 11/23/2022)   liraglutide  (VICTOZA ) 18 MG/3ML SOPN ADMINISTER 0.6 MG UNDER THE SKIN DAILY FOR 1 WEEK. INCREASE TO 1.2 MG UNDER THE SKIN DAILY (Patient not taking: Reported on 10/15/2023)   [DISCONTINUED] benzonatate (TESSALON) 100 MG capsule TAKE 1 CAPSULE BY MOUTH THREE TIMES A DAY (Patient not taking: Reported on 10/15/2023)   [DISCONTINUED] montelukast  (SINGULAIR ) 10 MG tablet Take 10 mg by mouth daily. (Patient not taking: Reported on 10/15/2023)   No facility-administered medications prior to visit.    Review of Systems  Constitutional:  Positive for chills. Negative for fever, malaise/fatigue and weight loss.  HENT:  Positive for congestion. Negative for sinus pain and sore throat.   Eyes: Negative.   Respiratory:  Positive for cough and sputum production. Negative for shortness of breath and wheezing.   Cardiovascular: Negative.  Negative for chest pain, palpitations and leg swelling.  Gastrointestinal: Negative.  Negative for abdominal pain, constipation, diarrhea, heartburn, nausea and vomiting.  Genitourinary: Negative.  Negative for dysuria and flank pain.  Musculoskeletal: Negative.  Negative for joint pain and myalgias.  Skin: Negative.   Neurological: Negative.  Negative for dizziness and headaches.  Endo/Heme/Allergies: Negative.   Psychiatric/Behavioral: Negative.   Negative for depression and suicidal ideas. The patient is not nervous/anxious.        Objective:   BP 128/74   Pulse 66   Temp 97.7 F (36.5 C) (Tympanic)   Ht 5\' 8"  (1.727 m)   Wt 170 lb 3.2 oz (77.2 kg)   SpO2 98%   BMI 25.88 kg/m   Vitals:   10/15/23 1402  BP: 128/74  Pulse: 66  Temp: 97.7 F (36.5 C)  Height: 5\' 8"  (1.727 m)  Weight: 170 lb 3.2 oz (77.2 kg)  SpO2: 98%  TempSrc: Tympanic  BMI (Calculated): 25.88    Physical Exam Vitals and nursing note reviewed.  Constitutional:      Appearance: Normal appearance.  HENT:     Head:  Normocephalic and atraumatic.     Nose: Nose normal.     Mouth/Throat:     Mouth: Mucous membranes are moist.     Pharynx: Oropharynx is clear.  Eyes:     Conjunctiva/sclera: Conjunctivae normal.     Pupils: Pupils are equal, round, and reactive to light.  Cardiovascular:     Rate and Rhythm: Normal rate and regular rhythm.     Pulses: Normal pulses.     Heart sounds: Normal heart sounds. No murmur heard. Pulmonary:     Effort: Pulmonary effort is normal.     Breath sounds: Normal breath sounds. No wheezing.  Abdominal:     General: Bowel sounds are normal.     Palpations: Abdomen is soft.     Tenderness: There is no abdominal tenderness. There is no right CVA tenderness or left CVA tenderness.  Musculoskeletal:        General: Normal range of motion.     Cervical back: Normal range of motion.     Right lower leg: No edema.     Left lower leg: No edema.  Skin:    General: Skin is warm and dry.  Neurological:     General: No focal deficit present.     Mental Status: She is alert and oriented to person, place, and time.  Psychiatric:        Mood and Affect: Mood normal.        Behavior: Behavior normal.      No results found for any visits on 10/15/23.  No results found for this or any previous visit (from the past 2160 hours).    Assessment & Plan:  Description sent for Levaquin and Medrol  Dosepak.  Patient  will take Mucinex .  Continue other inhalers.  Check labs today. Problem List Items Addressed This Visit     GERD (gastroesophageal reflux disease)   Relevant Orders   CBC with Diff   Essential hypertension, benign - Primary   Relevant Orders   CMP14+EGFR   Atrial fibrillation (HCC)   Bronchiectasis without complication (HCC)   Acquired hypothyroidism   Relevant Orders   TSH+T4F+T3Free   Mixed hyperlipidemia   Relevant Orders   Lipid Panel w/o Chol/HDL Ratio   Other Visit Diagnoses       Acute bronchitis, unspecified organism       Relevant Medications   levofloxacin (LEVAQUIN) 500 MG tablet   methylPREDNISolone  (MEDROL  DOSEPAK) 4 MG TBPK tablet     Prediabetes       Relevant Orders   Hemoglobin A1c       Return in about 10 days (around 10/25/2023).   Total time spent: 30 minutes  Aisha Hove, MD  10/15/2023   This document may have been prepared by [facility] Voice Recognition software and as such may include unintentional dictation errors.

## 2023-10-16 LAB — CMP14+EGFR
ALT: 19 IU/L (ref 0–32)
AST: 24 IU/L (ref 0–40)
Albumin: 3.9 g/dL (ref 3.8–4.8)
Alkaline Phosphatase: 88 IU/L (ref 44–121)
BUN/Creatinine Ratio: 17 (ref 12–28)
BUN: 17 mg/dL (ref 8–27)
Bilirubin Total: 0.5 mg/dL (ref 0.0–1.2)
CO2: 22 mmol/L (ref 20–29)
Calcium: 8.4 mg/dL — ABNORMAL LOW (ref 8.7–10.3)
Chloride: 100 mmol/L (ref 96–106)
Creatinine, Ser: 1.03 mg/dL — ABNORMAL HIGH (ref 0.57–1.00)
Globulin, Total: 2.9 g/dL (ref 1.5–4.5)
Glucose: 97 mg/dL (ref 70–99)
Potassium: 4.5 mmol/L (ref 3.5–5.2)
Sodium: 139 mmol/L (ref 134–144)
Total Protein: 6.8 g/dL (ref 6.0–8.5)
eGFR: 56 mL/min/{1.73_m2} — ABNORMAL LOW (ref >59)

## 2023-10-16 LAB — LIPID PANEL W/O CHOL/HDL RATIO
Cholesterol, Total: 151 mg/dL (ref 100–199)
HDL: 84 mg/dL (ref >39)
LDL Chol Calc (NIH): 53 mg/dL (ref 0–99)
Triglycerides: 72 mg/dL (ref 0–149)
VLDL Cholesterol Cal: 14 mg/dL (ref 5–40)

## 2023-10-16 LAB — CBC WITH DIFFERENTIAL/PLATELET
Basophils Absolute: 0.1 10*3/uL (ref 0.0–0.2)
Basos: 1 %
EOS (ABSOLUTE): 0.1 10*3/uL (ref 0.0–0.4)
Eos: 1 %
Hematocrit: 38.1 % (ref 34.0–46.6)
Hemoglobin: 12.1 g/dL (ref 11.1–15.9)
Immature Grans (Abs): 0 10*3/uL (ref 0.0–0.1)
Immature Granulocytes: 0 %
Lymphocytes Absolute: 3.3 10*3/uL — ABNORMAL HIGH (ref 0.7–3.1)
Lymphs: 34 %
MCH: 29.6 pg (ref 26.6–33.0)
MCHC: 31.8 g/dL (ref 31.5–35.7)
MCV: 93 fL (ref 79–97)
Monocytes Absolute: 0.8 10*3/uL (ref 0.1–0.9)
Monocytes: 8 %
Neutrophils Absolute: 5.5 10*3/uL (ref 1.4–7.0)
Neutrophils: 56 %
Platelets: 237 10*3/uL (ref 150–450)
RBC: 4.09 x10E6/uL (ref 3.77–5.28)
RDW: 12.7 % (ref 11.7–15.4)
WBC: 9.7 10*3/uL (ref 3.4–10.8)

## 2023-10-16 LAB — TSH+T4F+T3FREE
Free T4: 1.82 ng/dL — ABNORMAL HIGH (ref 0.82–1.77)
T3, Free: 1.7 pg/mL — ABNORMAL LOW (ref 2.0–4.4)
TSH: 1.39 u[IU]/mL (ref 0.450–4.500)

## 2023-10-16 LAB — HEMOGLOBIN A1C
Est. average glucose Bld gHb Est-mCnc: 103 mg/dL
Hgb A1c MFr Bld: 5.2 % (ref 4.8–5.6)

## 2023-10-17 ENCOUNTER — Ambulatory Visit: Payer: Self-pay | Admitting: Internal Medicine

## 2023-10-25 ENCOUNTER — Ambulatory Visit (INDEPENDENT_AMBULATORY_CARE_PROVIDER_SITE_OTHER): Admitting: Internal Medicine

## 2023-10-25 ENCOUNTER — Encounter: Payer: Self-pay | Admitting: Internal Medicine

## 2023-10-25 VITALS — BP 104/64 | HR 63 | Ht 68.0 in | Wt 174.0 lb

## 2023-10-25 DIAGNOSIS — E039 Hypothyroidism, unspecified: Secondary | ICD-10-CM

## 2023-10-25 DIAGNOSIS — I482 Chronic atrial fibrillation, unspecified: Secondary | ICD-10-CM

## 2023-10-25 DIAGNOSIS — E782 Mixed hyperlipidemia: Secondary | ICD-10-CM | POA: Diagnosis not present

## 2023-10-25 DIAGNOSIS — J479 Bronchiectasis, uncomplicated: Secondary | ICD-10-CM

## 2023-10-25 DIAGNOSIS — I10 Essential (primary) hypertension: Secondary | ICD-10-CM

## 2023-10-25 DIAGNOSIS — R7303 Prediabetes: Secondary | ICD-10-CM

## 2023-10-25 NOTE — Progress Notes (Signed)
 Established Patient Office Visit  Subjective:  Patient ID: Kathy Ashley, female    DOB: 04-Sep-1945  Age: 78 y.o. MRN: [id number]  Chief Complaint  Patient presents with   Follow-up    10 day follow up    Patient comes in for her follow-up today.  She has completed her prednisone and Levaquin  antibiotics.  Her cough and chest congestion have improved but not completely gone.  No more colored sputum.  Labs were done last visit, WBCs were normal.  Although she is feeling better but still feels tired as she has not been able to rest.  Her blood pressure is on the low side also.  She denies fevers or chills, no chest pain and no shortness of breath.  O2 saturation at room air is 98%.  Patient advised to rest over the next few days.  Also not to take her losartan  for a couple of days but monitor her blood pressure daily.  Can resume once her blood pressure goes up.  Patient advised to let us  know in case her cough and colored sputum return.    No other concerns at this time.   Past Medical History:  Diagnosis Date   Allergy    Asthma    Atrial fibrillation (HCC)    Collapsed lung 2016   COPD (chronic obstructive pulmonary disease) (HCC)    Hepatitis A 2016   Hypertension 2005   Pneumonia 2016   Seasonal allergies    Urinary incontinence     Past Surgical History:  Procedure Laterality Date   BREAST BIOPSY Right 2002   neg   CARDIOVERSION  02/09/2016   Due to A-fib/ Dr Hezekiah Louis   CESAREAN SECTION     COLONOSCOPY  [phone]   DILATION AND CURETTAGE OF UTERUS     ELECTROPHYSIOLOGIC STUDY N/A 02/09/2016   Procedure: CARDIOVERSION;  Surgeon: Cherrie Cornwall, MD;  Location: [facility];  Service: Cardiovascular;  Laterality: N/A;   INCISION AND DRAINAGE ABSCESS Right 07/21/2016   Procedure: INCISION AND DRAINAGE ABSCESS Right Lower Leg;  Surgeon: Emmalene Hare, MD;  Location: [facility];  Service: General;  Laterality: Right;   TONSILLECTOMY      Social History    Socioeconomic History   Marital status: Divorced    Spouse name: Not on file   Number of children: Not on file   Years of education: Not on file   Highest education level: Not on file  Occupational History   Occupation: Retired  Tobacco Use   Smoking status: Never   Smokeless tobacco: Never  Substance and Sexual Activity   Alcohol use: Yes    Comment: wine occasionally   Drug use: No   Sexual activity: Not on file  Other Topics Concern   Not on file  Social History Narrative   Not on file   Social Drivers of Health   Financial Resource Strain: Low Risk  (04/22/2023)   Overall Financial Resource Strain (CARDIA)    Difficulty of Paying Living Expenses: Not hard at all  Food Insecurity: No Food Insecurity (04/22/2023)   Hunger Vital Sign    Worried About Running Out of Food in the Last Year: Never true    Ran Out of Food in the Last Year: Never true  Transportation Needs: No Transportation Needs (04/22/2023)   PRAPARE - Administrator, Civil Service (Medical): No    Lack of Transportation (Non-Medical): No  Physical Activity: Insufficiently Active (04/22/2023)   Exercise Vital Sign  Days of Exercise per Week: 3 days    Minutes of Exercise per Session: 20 min  Stress: No Stress Concern Present (04/22/2023)   [employer] of Occupational Health - Occupational Stress Questionnaire    Feeling of Stress : Not at all  Social Connections: Not on file  Intimate Partner Violence: Not At Risk (04/22/2023)   Humiliation, Afraid, Rape, and Kick questionnaire    Fear of Current or Ex-Partner: No    Emotionally Abused: No    Physically Abused: No    Sexually Abused: No    Family History  Problem Relation Age of Onset   Diabetes Father    Heart disease Mother    Hypertension Mother    Stroke Mother    Breast cancer Neg Hx     Allergies  Allergen Reactions   Penicillins Hives    Outpatient Medications Prior to Visit  Medication Sig   amiodarone   (PACERONE ) 200 MG tablet TAKE 1 TABLET BY MOUTH EVERY DAY   ELIQUIS 5 MG TABS tablet TAKE 1 TABLET BY MOUTH TWICE A DAY   furosemide  (LASIX ) 20 MG tablet TAKE 1 TABLET BY MOUTH EVERY DAY   gabapentin  (NEURONTIN ) 300 MG capsule Take 1 capsule (300 mg total) by mouth at bedtime.   levothyroxine  (SYNTHROID ) 88 MCG tablet Take 1 tablet (88 mcg total) by mouth daily before breakfast.   losartan  (COZAAR ) 50 MG tablet Take 1 tablet (50 mg total) by mouth daily.   methylPREDNISolone  (MEDROL  DOSEPAK) 4 MG TBPK tablet Use as directed   metoprolol  succinate (TOPROL -XL) 25 MG 24 hr tablet Take 1 tablet (25 mg total) by mouth daily.   rosuvastatin  (CRESTOR ) 20 MG tablet Take 1 tablet (20 mg total) by mouth daily.   traZODone  (DESYREL ) 150 MG tablet TAKE 1 TABLET BY MOUTH EVERYDAY AT BEDTIME   venlafaxine  XR (EFFEXOR -XR) 75 MG 24 hr capsule TAKE 1 CAPSULE BY MOUTH EVERY DAY   fluticasone furoate-vilanterol (BREO ELLIPTA) 100-25 MCG/INH AEPB Inhale into the lungs. (Patient not taking: Reported on 10/25/2023)   liraglutide  (VICTOZA ) 18 MG/3ML SOPN ADMINISTER 0.6 MG UNDER THE SKIN DAILY FOR 1 WEEK. INCREASE TO 1.2 MG UNDER THE SKIN DAILY (Patient not taking: Reported on 10/25/2023)   No facility-administered medications prior to visit.    Review of Systems  Constitutional:  Positive for malaise/fatigue. Negative for chills, diaphoresis, fever and weight loss.  HENT: Negative.  Negative for congestion, sinus pain and sore throat.   Eyes: Negative.   Respiratory:  Positive for cough. Negative for shortness of breath.   Cardiovascular: Negative.  Negative for chest pain, palpitations and leg swelling.  Gastrointestinal: Negative.  Negative for abdominal pain, constipation, diarrhea, heartburn, nausea and vomiting.  Genitourinary: Negative.  Negative for dysuria and flank pain.  Musculoskeletal: Negative.  Negative for joint pain and myalgias.  Skin: Negative.   Neurological: Negative.  Negative for dizziness,  tingling, tremors and headaches.  Endo/Heme/Allergies: Negative.   Psychiatric/Behavioral: Negative.  Negative for depression and suicidal ideas. The patient is not nervous/anxious.        Objective:   BP 104/64   Pulse 63   Ht 5' 8 (1.727 m)   Wt 174 lb (78.9 kg)   SpO2 98%   BMI 26.46 kg/m   Vitals:   10/25/23 1442  BP: 104/64  Pulse: 63  Height: 5' 8 (1.727 m)  Weight: 174 lb (78.9 kg)  SpO2: 98%  BMI (Calculated): 26.46    Physical Exam Vitals and nursing note reviewed.  Constitutional:  Appearance: Normal appearance.  HENT:     Head: Normocephalic and atraumatic.     Nose: Nose normal.     Mouth/Throat:     Mouth: Mucous membranes are moist.     Pharynx: Oropharynx is clear.   Eyes:     Conjunctiva/sclera: Conjunctivae normal.     Pupils: Pupils are equal, round, and reactive to light.    Cardiovascular:     Rate and Rhythm: Normal rate and regular rhythm.     Pulses: Normal pulses.     Heart sounds: Normal heart sounds. No murmur heard. Pulmonary:     Effort: Pulmonary effort is normal.     Breath sounds: Normal breath sounds. No wheezing.  Abdominal:     General: Bowel sounds are normal.     Palpations: Abdomen is soft.     Tenderness: There is no abdominal tenderness. There is no right CVA tenderness or left CVA tenderness.   Musculoskeletal:        General: Normal range of motion.     Cervical back: Normal range of motion.     Right lower leg: No edema.     Left lower leg: No edema.   Skin:    General: Skin is warm and dry.   Neurological:     General: No focal deficit present.     Mental Status: She is alert and oriented to person, place, and time.   Psychiatric:        Mood and Affect: Mood normal.        Behavior: Behavior normal.      No results found for any visits on 10/25/23.  Recent Results (from the past 2160 hours)  CBC with Diff     Status: Abnormal   Collection Time: 10/15/23  2:33 PM  Result Value Ref Range    WBC 9.7 3.4 - 10.8 x10E3/uL   RBC 4.09 3.77 - 5.28 x10E6/uL   Hemoglobin 12.1 11.1 - 15.9 g/dL   Hematocrit 95.6 38.7 - 46.6 %   MCV 93 79 - 97 fL   MCH 29.6 26.6 - 33.0 pg   MCHC 31.8 31.5 - 35.7 g/dL   RDW 56.4 33.2 - 95.1 %   Platelets 237 150 - 450 x10E3/uL   Neutrophils 56 Not Estab. %   Lymphs 34 Not Estab. %   Monocytes 8 Not Estab. %   Eos 1 Not Estab. %   Basos 1 Not Estab. %   Neutrophils Absolute 5.5 1.4 - 7.0 x10E3/uL   Lymphocytes Absolute 3.3 (H) 0.7 - 3.1 x10E3/uL   Monocytes Absolute 0.8 0.1 - 0.9 x10E3/uL   EOS (ABSOLUTE) 0.1 0.0 - 0.4 x10E3/uL   Basophils Absolute 0.1 0.0 - 0.2 x10E3/uL   Immature Granulocytes 0 Not Estab. %   Immature Grans (Abs) 0.0 0.0 - 0.1 x10E3/uL  CMP14+EGFR     Status: Abnormal   Collection Time: 10/15/23  2:33 PM  Result Value Ref Range   Glucose 97 70 - 99 mg/dL   BUN 17 8 - 27 mg/dL   Creatinine, Ser 8.84 (H) 0.57 - 1.00 mg/dL   eGFR 56 (L) >16 SA/YTK/1.60   BUN/Creatinine Ratio 17 12 - 28   Sodium 139 134 - 144 mmol/L   Potassium 4.5 3.5 - 5.2 mmol/L   Chloride 100 96 - 106 mmol/L   CO2 22 20 - 29 mmol/L   Calcium  8.4 (L) 8.7 - 10.3 mg/dL   Total Protein 6.8 6.0 - 8.5 g/dL   Albumin 3.9  3.8 - 4.8 g/dL   Globulin, Total 2.9 1.5 - 4.5 g/dL   Bilirubin Total 0.5 0.0 - 1.2 mg/dL   Alkaline Phosphatase 88 44 - 121 IU/L   AST 24 0 - 40 IU/L   ALT 19 0 - 32 IU/L  TSH+T4F+T3Free     Status: Abnormal   Collection Time: 10/15/23  2:33 PM  Result Value Ref Range   TSH 1.390 0.450 - 4.500 uIU/mL   T3, Free 1.7 (L) 2.0 - 4.4 pg/mL   Free T4 1.82 (H) 0.82 - 1.77 ng/dL  Lipid Panel w/o Chol/HDL Ratio     Status: None   Collection Time: 10/15/23  2:33 PM  Result Value Ref Range   Cholesterol, Total 151 100 - 199 mg/dL   Triglycerides 72 0 - 149 mg/dL   HDL 84 >03 mg/dL   VLDL Cholesterol Cal 14 5 - 40 mg/dL   LDL Chol Calc (NIH) 53 0 - 99 mg/dL  Hemoglobin K7Q     Status: None   Collection Time: 10/15/23  2:33 PM  Result Value  Ref Range   Hgb A1c MFr Bld 5.2 4.8 - 5.6 %    Comment:          Prediabetes: 5.7 - 6.4          Diabetes: >6.4          Glycemic control for adults with diabetes: <7.0    Est. average glucose Bld gHb Est-mCnc 103 mg/dL      Assessment & Plan:  Patient advised to rest, fluids and hold her losartan  for 2 to 3 days.  But monitor her blood pressure closely.  Also to inform us  or go to emergency room if feels more weak. Problem List Items Addressed This Visit     Essential hypertension, benign - Primary   Atrial fibrillation (HCC)   Bronchiectasis without complication (HCC)   Acquired hypothyroidism   Mixed hyperlipidemia   Other Visit Diagnoses       Prediabetes           Return in about 3 months (around 01/25/2024).   Total time spent: 30 minutes  Aisha Hove, MD  10/25/2023   This document may have been prepared by [facility] Voice Recognition software and as such may include unintentional dictation errors.

## 2023-10-28 ENCOUNTER — Other Ambulatory Visit: Payer: Self-pay | Admitting: Internal Medicine

## 2023-10-28 DIAGNOSIS — R7309 Other abnormal glucose: Secondary | ICD-10-CM

## 2023-12-31 ENCOUNTER — Other Ambulatory Visit: Payer: Self-pay | Admitting: Cardiovascular Disease

## 2023-12-31 DIAGNOSIS — I480 Paroxysmal atrial fibrillation: Secondary | ICD-10-CM

## 2024-01-27 ENCOUNTER — Ambulatory Visit: Admitting: Internal Medicine

## 2024-04-01 ENCOUNTER — Other Ambulatory Visit: Payer: Self-pay | Admitting: Cardiovascular Disease

## 2024-04-01 DIAGNOSIS — I480 Paroxysmal atrial fibrillation: Secondary | ICD-10-CM

## 2024-04-09 ENCOUNTER — Encounter: Payer: Self-pay | Admitting: Cardiovascular Disease

## 2024-04-09 ENCOUNTER — Ambulatory Visit: Admitting: Cardiovascular Disease

## 2024-04-09 VITALS — BP 142/86 | HR 71 | Ht 68.0 in | Wt 178.6 lb

## 2024-04-09 DIAGNOSIS — E782 Mixed hyperlipidemia: Secondary | ICD-10-CM

## 2024-04-09 DIAGNOSIS — I10 Essential (primary) hypertension: Secondary | ICD-10-CM

## 2024-04-09 DIAGNOSIS — Z131 Encounter for screening for diabetes mellitus: Secondary | ICD-10-CM

## 2024-04-09 DIAGNOSIS — I482 Chronic atrial fibrillation, unspecified: Secondary | ICD-10-CM

## 2024-04-09 DIAGNOSIS — I480 Paroxysmal atrial fibrillation: Secondary | ICD-10-CM | POA: Diagnosis not present

## 2024-04-09 MED ORDER — LOSARTAN POTASSIUM 100 MG PO TABS: 100.0000 mg | ORAL_TABLET | Freq: Every day | ORAL | 11 refills | Status: AC

## 2024-04-09 NOTE — Progress Notes (Signed)
 Cardiology Office Note   Date:  04/09/2024   ID:  [name], [name] [birth year], MRN [id number]  PCP:  Fernand Fredy RAMAN, MD  Cardiologist:  Denyse Fernand, MD      History of Present Illness: Kathy Ashley is a 78 y.o. female who presents for  Chief Complaint  Patient presents with   Follow-up    Cardiac follow up for eliquis refill    Has SOB, and tired.      Past Medical History:  Diagnosis Date   Allergy    Asthma    Atrial fibrillation (HCC)    Collapsed lung 2016   COPD (chronic obstructive pulmonary disease) (HCC)    Hepatitis A 2016   Hypertension 2005   Pneumonia 2016   Seasonal allergies    Urinary incontinence      Past Surgical History:  Procedure Laterality Date   BREAST BIOPSY Right 2002   neg   CARDIOVERSION  02/09/2016   Due to A-fib/ Dr Dedra Fernand   CESAREAN SECTION     COLONOSCOPY  [phone]   DILATION AND CURETTAGE OF UTERUS     ELECTROPHYSIOLOGIC STUDY N/A 02/09/2016   Procedure: CARDIOVERSION;  Surgeon: Denyse DELENA Fernand, MD;  Location: [facility];  Service: Cardiovascular;  Laterality: N/A;   INCISION AND DRAINAGE ABSCESS Right 07/21/2016   Procedure: INCISION AND DRAINAGE ABSCESS Right Lower Leg;  Surgeon: Aloysius Plant, MD;  Location: [facility];  Service: General;  Laterality: Right;   TONSILLECTOMY       Current Outpatient Medications  Medication Sig Dispense Refill   amiodarone  (PACERONE ) 200 MG tablet TAKE 1 TABLET BY MOUTH EVERY DAY 90 tablet 3   ELIQUIS 5 MG TABS tablet TAKE 1 TABLET BY MOUTH TWICE A DAY 180 tablet 0   furosemide  (LASIX ) 20 MG tablet TAKE 1 TABLET BY MOUTH EVERY DAY 90 tablet 3   levothyroxine  (SYNTHROID ) 88 MCG tablet Take 1 tablet (88 mcg total) by mouth daily before breakfast. 90 tablet 3   losartan  (COZAAR ) 100 MG tablet Take 1 tablet (100 mg total) by mouth daily. 30 tablet 11   metoprolol  succinate (TOPROL -XL) 25 MG 24 hr tablet Take 1 tablet (25 mg total) by mouth daily. 90 tablet 3    rosuvastatin  (CRESTOR ) 20 MG tablet Take 1 tablet (20 mg total) by mouth daily. 90 tablet 3   traZODone  (DESYREL ) 150 MG tablet TAKE 1 TABLET BY MOUTH EVERYDAY AT BEDTIME 90 tablet 3   venlafaxine  XR (EFFEXOR -XR) 75 MG 24 hr capsule TAKE 1 CAPSULE BY MOUTH EVERY DAY 90 capsule 3   No current facility-administered medications for this visit.    Allergies:   Penicillins    Social History:   reports that she has never smoked. She has never used smokeless tobacco. She reports current alcohol use. She reports that she does not use drugs.   Family History:  family history includes Diabetes in her father; Heart disease in her mother; Hypertension in her mother; Stroke in her mother.    ROS:     Review of Systems  Constitutional: Negative.   HENT: Negative.    Eyes: Negative.   Respiratory: Negative.    Gastrointestinal: Negative.   Genitourinary: Negative.   Musculoskeletal: Negative.   Skin: Negative.   Neurological: Negative.   Endo/Heme/Allergies: Negative.   Psychiatric/Behavioral: Negative.    All other systems reviewed and are negative.     All other systems are reviewed and negative.    PHYSICAL EXAM: VS:  BP (!) 142/86   Pulse 71   Ht 5' 8 (1.727 m)   Wt 178 lb 9.6 oz (81 kg)   SpO2 95%   BMI 27.16 kg/m  , BMI Body mass index is 27.16 kg/m. Last weight:  Wt Readings from Last 3 Encounters:  04/09/24 178 lb 9.6 oz (81 kg)  10/25/23 174 lb (78.9 kg)  10/15/23 170 lb 3.2 oz (77.2 kg)     Physical Exam Constitutional:      Appearance: Normal appearance.  Cardiovascular:     Rate and Rhythm: Normal rate and regular rhythm.     Heart sounds: Normal heart sounds.  Pulmonary:     Effort: Pulmonary effort is normal.     Breath sounds: Normal breath sounds.  Musculoskeletal:     Right lower leg: No edema.     Left lower leg: No edema.  Neurological:     Mental Status: She is alert.       EKG:   Recent Labs: 10/15/2023: ALT 19; BUN 17; Creatinine, Ser  1.03; Hemoglobin 12.1; Platelets 237; Potassium 4.5; Sodium 139; TSH 1.390    Lipid Panel    Component Value Date/Time   CHOL 151 10/15/2023 1433   CHOL 172 05/24/2012 0639   TRIG 72 10/15/2023 1433   TRIG 69 05/24/2012 0639   HDL 84 10/15/2023 1433   HDL 73 (H) 05/24/2012 0639   VLDL 14 05/24/2012 0639   LDLCALC 53 10/15/2023 1433   LDLCALC 85 05/24/2012 0639      Other studies Reviewed: Additional studies/ records that were reviewed today include:  Review of the above records demonstrates:       No data to display            ASSESSMENT AND PLAN:    ICD-10-CM   1. Chronic atrial fibrillation (HCC)  I48.20 PCV ECHOCARDIOGRAM COMPLETE    2. Essential hypertension, benign  I10 PCV ECHOCARDIOGRAM COMPLETE   Change losartan  100 daily as BP high    3. Mixed hyperlipidemia  E78.2 PCV ECHOCARDIOGRAM COMPLETE    4. Paroxysmal atrial fibrillation (HCC)  I48.0 PCV ECHOCARDIOGRAM COMPLETE       Problem List Items Addressed This Visit       Cardiovascular and Mediastinum   Essential hypertension, benign   Relevant Medications   losartan  (COZAAR ) 100 MG tablet   Other Relevant Orders   PCV ECHOCARDIOGRAM COMPLETE   Atrial fibrillation (HCC) - Primary   Relevant Medications   losartan  (COZAAR ) 100 MG tablet   Other Relevant Orders   PCV ECHOCARDIOGRAM COMPLETE   PCV ECHOCARDIOGRAM COMPLETE     Other   Mixed hyperlipidemia   Relevant Medications   losartan  (COZAAR ) 100 MG tablet   Other Relevant Orders   PCV ECHOCARDIOGRAM COMPLETE       Disposition:   Return in about 6 weeks (around 05/21/2024) for echo and f/u.    Total time spent: 30 minutes  Signed,  Denyse Bathe, MD  04/09/2024 11:25 AM    [facility]

## 2024-05-03 ENCOUNTER — Other Ambulatory Visit: Payer: Self-pay | Admitting: Internal Medicine

## 2024-05-04 ENCOUNTER — Other Ambulatory Visit: Payer: Self-pay

## 2024-05-20 ENCOUNTER — Other Ambulatory Visit

## 2024-05-26 ENCOUNTER — Ambulatory Visit: Admitting: Cardiovascular Disease

## 2024-05-29 ENCOUNTER — Other Ambulatory Visit: Payer: Self-pay | Admitting: Internal Medicine

## 2024-05-29 ENCOUNTER — Ambulatory Visit

## 2024-05-29 DIAGNOSIS — I351 Nonrheumatic aortic (valve) insufficiency: Secondary | ICD-10-CM | POA: Diagnosis not present

## 2024-05-29 DIAGNOSIS — E782 Mixed hyperlipidemia: Secondary | ICD-10-CM

## 2024-05-29 DIAGNOSIS — I371 Nonrheumatic pulmonary valve insufficiency: Secondary | ICD-10-CM

## 2024-05-29 DIAGNOSIS — I340 Nonrheumatic mitral (valve) insufficiency: Secondary | ICD-10-CM | POA: Diagnosis not present

## 2024-05-29 DIAGNOSIS — I361 Nonrheumatic tricuspid (valve) insufficiency: Secondary | ICD-10-CM

## 2024-05-29 DIAGNOSIS — I10 Essential (primary) hypertension: Secondary | ICD-10-CM

## 2024-05-29 DIAGNOSIS — I482 Chronic atrial fibrillation, unspecified: Secondary | ICD-10-CM

## 2024-05-29 DIAGNOSIS — I480 Paroxysmal atrial fibrillation: Secondary | ICD-10-CM

## 2024-06-02 ENCOUNTER — Ambulatory Visit: Admitting: Cardiovascular Disease

## 2024-06-02 ENCOUNTER — Encounter: Payer: Self-pay | Admitting: Cardiovascular Disease

## 2024-06-02 VITALS — BP 124/66 | HR 61 | Ht 68.0 in | Wt 174.0 lb

## 2024-06-02 DIAGNOSIS — R42 Dizziness and giddiness: Secondary | ICD-10-CM

## 2024-06-02 DIAGNOSIS — I10 Essential (primary) hypertension: Secondary | ICD-10-CM

## 2024-06-02 DIAGNOSIS — E782 Mixed hyperlipidemia: Secondary | ICD-10-CM

## 2024-06-02 DIAGNOSIS — I480 Paroxysmal atrial fibrillation: Secondary | ICD-10-CM

## 2024-06-02 DIAGNOSIS — I361 Nonrheumatic tricuspid (valve) insufficiency: Secondary | ICD-10-CM

## 2024-06-02 DIAGNOSIS — R0602 Shortness of breath: Secondary | ICD-10-CM

## 2024-06-02 DIAGNOSIS — I351 Nonrheumatic aortic (valve) insufficiency: Secondary | ICD-10-CM | POA: Diagnosis not present

## 2024-06-02 DIAGNOSIS — I340 Nonrheumatic mitral (valve) insufficiency: Secondary | ICD-10-CM | POA: Diagnosis not present

## 2024-06-02 NOTE — Progress Notes (Signed)
 "     Cardiology Office Note   Date:  06/02/2024   ID:  Shuna, Tabor [birth year], MRN [id number]  PCP:  Fernand Fredy RAMAN, MD  Cardiologist:  Denyse Fernand, MD      History of Present Illness: Kathy Ashley is a 79 y.o. female who presents for  Chief Complaint  Patient presents with   Follow-up    ECHO Results    Doing well, no chest pain but gets dizzy standing up.      Past Medical History:  Diagnosis Date   Allergy    Asthma    Atrial fibrillation (HCC)    Collapsed lung 2016   COPD (chronic obstructive pulmonary disease) (HCC)    Hepatitis A 2016   Hypertension 2005   Pneumonia 2016   Seasonal allergies    Urinary incontinence      Past Surgical History:  Procedure Laterality Date   BREAST BIOPSY Right 2002   neg   CARDIOVERSION  02/09/2016   Due to A-fib/ Dr Dedra Fernand   CESAREAN SECTION     COLONOSCOPY  [phone]   DILATION AND CURETTAGE OF UTERUS     ELECTROPHYSIOLOGIC STUDY N/A 02/09/2016   Procedure: CARDIOVERSION;  Surgeon: Denyse DELENA Fernand, MD;  Location: [facility];  Service: Cardiovascular;  Laterality: N/A;   INCISION AND DRAINAGE ABSCESS Right 07/21/2016   Procedure: INCISION AND DRAINAGE ABSCESS Right Lower Leg;  Surgeon: Aloysius Plant, MD;  Location: [facility];  Service: General;  Laterality: Right;   TONSILLECTOMY       Current Outpatient Medications  Medication Sig Dispense Refill   amiodarone  (PACERONE ) 200 MG tablet TAKE 1 TABLET BY MOUTH EVERY DAY 90 tablet 3   ELIQUIS 5 MG TABS tablet TAKE 1 TABLET BY MOUTH TWICE A DAY 180 tablet 0   furosemide  (LASIX ) 20 MG tablet TAKE 1 TABLET BY MOUTH EVERY DAY 90 tablet 3   levothyroxine  (SYNTHROID ) 88 MCG tablet TAKE 1 TABLET BY MOUTH DAILY BEFORE BREAKFAST. 90 tablet 3   losartan  (COZAAR ) 100 MG tablet Take 1 tablet (100 mg total) by mouth daily. 30 tablet 11   metoprolol  succinate (TOPROL -XL) 25 MG 24 hr tablet Take 1 tablet (25 mg total) by mouth daily. 90 tablet 3   rosuvastatin   (CRESTOR ) 20 MG tablet Take 1 tablet (20 mg total) by mouth daily. 90 tablet 3   traZODone  (DESYREL ) 150 MG tablet TAKE 1 TABLET BY MOUTH EVERYDAY AT BEDTIME 90 tablet 3   venlafaxine  XR (EFFEXOR -XR) 75 MG 24 hr capsule TAKE 1 CAPSULE BY MOUTH EVERY DAY 90 capsule 3   No current facility-administered medications for this visit.    Allergies:   Penicillins    Social History:   reports that she has never smoked. She has never used smokeless tobacco. She reports current alcohol use. She reports that she does not use drugs.   Family History:  family history includes Diabetes in her father; Heart disease in her mother; Hypertension in her mother; Stroke in her mother.    ROS:     Review of Systems  Constitutional: Negative.   HENT: Negative.    Eyes: Negative.   Respiratory: Negative.    Gastrointestinal: Negative.   Genitourinary: Negative.   Musculoskeletal: Negative.   Skin: Negative.   Neurological: Negative.   Endo/Heme/Allergies: Negative.   Psychiatric/Behavioral: Negative.    All other systems reviewed and are negative.     All other systems are reviewed and negative.    PHYSICAL EXAM:  VS:  BP 124/66   Pulse 61   Ht 5' 8 (1.727 m)   Wt 174 lb (78.9 kg)   SpO2 97%   BMI 26.46 kg/m  , BMI Body mass index is 26.46 kg/m. Last weight:  Wt Readings from Last 3 Encounters:  06/02/24 174 lb (78.9 kg)  04/09/24 178 lb 9.6 oz (81 kg)  10/25/23 174 lb (78.9 kg)     Physical Exam Constitutional:      Appearance: Normal appearance.  Cardiovascular:     Rate and Rhythm: Normal rate and regular rhythm.     Heart sounds: Normal heart sounds.  Pulmonary:     Effort: Pulmonary effort is normal.     Breath sounds: Normal breath sounds.  Musculoskeletal:     Right lower leg: No edema.     Left lower leg: No edema.  Neurological:     Mental Status: She is alert.       EKG:   Recent Labs: 10/15/2023: ALT 19; BUN 17; Creatinine, Ser 1.03; Hemoglobin 12.1;  Platelets 237; Potassium 4.5; Sodium 139; TSH 1.390    Lipid Panel    Component Value Date/Time   CHOL 151 10/15/2023 1433   CHOL 172 05/24/2012 0639   TRIG 72 10/15/2023 1433   TRIG 69 05/24/2012 0639   HDL 84 10/15/2023 1433   HDL 73 (H) 05/24/2012 0639   VLDL 14 05/24/2012 0639   LDLCALC 53 10/15/2023 1433   LDLCALC 85 05/24/2012 0639      Other studies Reviewed: Additional studies/ records that were reviewed today include:  Review of the above records demonstrates:       No data to display            ASSESSMENT AND PLAN:    ICD-10-CM   1. Essential hypertension, benign  I10     2. Mixed hyperlipidemia  E78.2     3. Paroxysmal atrial fibrillation (HCC)  I48.0     4. Nonrheumatic mitral valve regurgitation  I34.0     5. Dizziness  R42    probably orthostatic    6. SOB (shortness of breath)  R06.02     7. 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 Nonrheumatic tricuspid valve regurgitation  I36.1    moderate TR       Problem List Items Addressed This Visit       Cardiovascular and Mediastinum   Essential hypertension, benign - Primary   Atrial fibrillation (HCC)     Other   Mixed hyperlipidemia   Other Visit Diagnoses       Nonrheumatic mitral valve regurgitation         Dizziness       probably orthostatic     SOB (shortness of breath)         Nonrheumatic aortic valve insufficiency       mild AR     Nonrheumatic tricuspid valve regurgitation       moderate TR          Disposition:   Return in about 2 months (around 07/31/2024).    Total time spent: 35 minutes  Signed,  Denyse Bathe, MD  06/02/2024 1:55 PM    [facility] "

## 2024-08-21 ENCOUNTER — Ambulatory Visit: Admitting: Internal Medicine
